# Patient Record
Sex: Female | Born: 1955 | Race: White | Hispanic: No | Marital: Married | Smoking: Never smoker
Health system: Southern US, Community
[De-identification: ages and names within clinical notes are randomized; demographics above are authoritative.]

## PROBLEM LIST (undated history)

## (undated) DIAGNOSIS — Z9889 Other specified postprocedural states: Secondary | ICD-10-CM

## (undated) DIAGNOSIS — R430 Anosmia: Secondary | ICD-10-CM

## (undated) DIAGNOSIS — R112 Nausea with vomiting, unspecified: Secondary | ICD-10-CM

## (undated) DIAGNOSIS — M1990 Unspecified osteoarthritis, unspecified site: Secondary | ICD-10-CM

## (undated) DIAGNOSIS — I471 Supraventricular tachycardia, unspecified: Secondary | ICD-10-CM

## (undated) HISTORY — PX: CHOLECYSTECTOMY: SHX55

## (undated) HISTORY — PX: DILATION AND CURETTAGE OF UTERUS: SHX78

## (undated) HISTORY — PX: COLONOSCOPY: SHX174

---

## 2019-09-24 ENCOUNTER — Other Ambulatory Visit: Payer: Self-pay | Admitting: Podiatry

## 2019-11-02 ENCOUNTER — Other Ambulatory Visit: Payer: Self-pay

## 2020-01-09 ENCOUNTER — Other Ambulatory Visit: Payer: Self-pay

## 2020-01-09 ENCOUNTER — Encounter: Payer: Self-pay | Admitting: Podiatry

## 2020-01-16 ENCOUNTER — Other Ambulatory Visit: Payer: Self-pay | Admitting: Podiatry

## 2020-01-17 NOTE — Anesthesia Preprocedure Evaluation (Addendum)
Anesthesia Evaluation  Patient identified by MRN, date of birth, ID band Patient awake    Reviewed: Allergy & Precautions, NPO status , Patient's Chart, lab work & pertinent test results, reviewed documented beta blocker date and time   History of Anesthesia Complications (+) PONV and history of anesthetic complications  Airway Mallampati: II  TM Distance: >3 FB Neck ROM: Full    Dental   Pulmonary    breath sounds clear to auscultation       Cardiovascular (-) angina(-) DOE  Rhythm:Regular Rate:Normal     Neuro/Psych    GI/Hepatic neg GERD  ,  Endo/Other    Renal/GU      Musculoskeletal  (+) Arthritis ,   Abdominal (+) + obese (BMI 37),   Peds  Hematology   Anesthesia Other Findings   Reproductive/Obstetrics                            Anesthesia Physical Anesthesia Plan  ASA: II  Anesthesia Plan: General   Post-op Pain Management:  Regional for Post-op pain   Induction: Intravenous  PONV Risk Score and Plan: 4 or greater and Propofol infusion, TIVA, Treatment may vary due to age or medical condition, Ondansetron and Dexamethasone  Airway Management Planned: Natural Airway and Nasal Cannula  Additional Equipment:   Intra-op Plan:   Post-operative Plan:   Informed Consent: I have reviewed the patients History and Physical, chart, labs and discussed the procedure including the risks, benefits and alternatives for the proposed anesthesia with the patient or authorized representative who has indicated his/her understanding and acceptance.       Plan Discussed with: CRNA and Anesthesiologist  Anesthesia Plan Comments:         Anesthesia Quick Evaluation

## 2020-01-18 ENCOUNTER — Other Ambulatory Visit
Admission: RE | Admit: 2020-01-18 | Discharge: 2020-01-18 | Disposition: A | Discharge status: Home / Self Care | Payer: 59 | Source: Ambulatory Visit | Attending: Podiatry | Admitting: Podiatry

## 2020-01-18 DIAGNOSIS — Z20822 Contact with and (suspected) exposure to covid-19: Secondary | ICD-10-CM | POA: Diagnosis not present

## 2020-01-18 DIAGNOSIS — Z01812 Encounter for preprocedural laboratory examination: Secondary | ICD-10-CM | POA: Diagnosis not present

## 2020-01-18 LAB — SARS CORONAVIRUS 2 (TAT 6-24 HRS): SARS Coronavirus 2: NEGATIVE

## 2020-01-22 ENCOUNTER — Encounter: Payer: Self-pay | Admitting: Podiatry

## 2020-01-22 ENCOUNTER — Ambulatory Visit: Payer: 59 | Admitting: Anesthesiology

## 2020-01-22 ENCOUNTER — Ambulatory Visit
Admission: RE | Admit: 2020-01-22 | Discharge: 2020-01-22 | Disposition: A | Discharge status: Home / Self Care | Payer: 59 | Attending: Podiatry | Admitting: Podiatry

## 2020-01-22 ENCOUNTER — Other Ambulatory Visit: Payer: Self-pay

## 2020-01-22 ENCOUNTER — Encounter
Admission: RE | Disposition: A | Discharge status: Home / Self Care | Payer: Self-pay | Source: Home / Self Care | Attending: Podiatry

## 2020-01-22 ENCOUNTER — Emergency Department
Admission: EM | Admit: 2020-01-22 | Discharge: 2020-01-22 | Disposition: A | Discharge status: Home / Self Care | Payer: 59 | Source: Home / Self Care | Attending: Student in an Organized Health Care Education/Training Program | Admitting: Student in an Organized Health Care Education/Training Program

## 2020-01-22 DIAGNOSIS — M2141 Flat foot [pes planus] (acquired), right foot: Secondary | ICD-10-CM | POA: Diagnosis not present

## 2020-01-22 DIAGNOSIS — S0501XA Injury of conjunctiva and corneal abrasion without foreign body, right eye, initial encounter: Secondary | ICD-10-CM

## 2020-01-22 DIAGNOSIS — Z09 Encounter for follow-up examination after completed treatment for conditions other than malignant neoplasm: Secondary | ICD-10-CM

## 2020-01-22 DIAGNOSIS — M249 Joint derangement, unspecified: Secondary | ICD-10-CM | POA: Diagnosis not present

## 2020-01-22 DIAGNOSIS — M7741 Metatarsalgia, right foot: Secondary | ICD-10-CM | POA: Diagnosis not present

## 2020-01-22 DIAGNOSIS — M2011 Hallux valgus (acquired), right foot: Secondary | ICD-10-CM | POA: Diagnosis present

## 2020-01-22 DIAGNOSIS — M2041 Other hammer toe(s) (acquired), right foot: Secondary | ICD-10-CM | POA: Insufficient documentation

## 2020-01-22 HISTORY — DX: Other specified postprocedural states: Z98.890

## 2020-01-22 HISTORY — DX: Anosmia: R43.0

## 2020-01-22 HISTORY — DX: Other specified postprocedural states: R11.2

## 2020-01-22 HISTORY — PX: FLAT FOOT CORRECTION: SHX6619

## 2020-01-22 HISTORY — PX: BUNIONECTOMY: SHX129

## 2020-01-22 HISTORY — DX: Unspecified osteoarthritis, unspecified site: M19.90

## 2020-01-22 HISTORY — PX: HAMMER TOE SURGERY: SHX385

## 2020-01-22 SURGERY — BUNIONECTOMY
Anesthesia: General | Site: Toe | Laterality: Right

## 2020-01-22 MED ORDER — LACTATED RINGERS IV SOLN
100.0000 mL/h | INTRAVENOUS | Status: DC
  Administered 2020-01-22: 100 mL/h via INTRAVENOUS

## 2020-01-22 MED ORDER — PROPOFOL 10 MG/ML IV BOLUS
INTRAVENOUS | Status: DC | PRN
  Administered 2020-01-22 (×3): 20 mg via INTRAVENOUS

## 2020-01-22 MED ORDER — POVIDONE-IODINE 7.5 % EX SOLN: Freq: Once | CUTANEOUS | Status: DC

## 2020-01-22 MED ORDER — FENTANYL CITRATE (PF) 100 MCG/2ML IJ SOLN
INTRAMUSCULAR | Status: DC | PRN
  Administered 2020-01-22: 100 ug via INTRAVENOUS

## 2020-01-22 MED ORDER — TETRACAINE HCL 0.5 % OP SOLN
2.0000 [drp] | Freq: Once | OPHTHALMIC | Status: DC
  Filled 2020-01-22: qty 4

## 2020-01-22 MED ORDER — OXYCODONE HCL 5 MG/5ML PO SOLN: 5.0000 mg | Freq: Once | ORAL | Status: DC | PRN

## 2020-01-22 MED ORDER — ROPIVACAINE HCL 5 MG/ML IJ SOLN
INTRAMUSCULAR | Status: DC | PRN
  Administered 2020-01-22: 25 mL via EPIDURAL
  Administered 2020-01-22: 15 mL via EPIDURAL

## 2020-01-22 MED ORDER — PROMETHAZINE HCL 25 MG/ML IJ SOLN: 6.2500 mg | INTRAMUSCULAR | Status: DC | PRN

## 2020-01-22 MED ORDER — EPHEDRINE SULFATE 50 MG/ML IJ SOLN
INTRAMUSCULAR | Status: DC | PRN
  Administered 2020-01-22: 5 mg via INTRAVENOUS

## 2020-01-22 MED ORDER — LIDOCAINE HCL (CARDIAC) PF 100 MG/5ML IV SOSY
PREFILLED_SYRINGE | INTRAVENOUS | Status: DC | PRN
  Administered 2020-01-22: 40 mg via INTRAVENOUS

## 2020-01-22 MED ORDER — MIDAZOLAM HCL 2 MG/2ML IJ SOLN
INTRAMUSCULAR | Status: DC | PRN
  Administered 2020-01-22: 1 mg via INTRAVENOUS

## 2020-01-22 MED ORDER — OXYCODONE HCL 5 MG PO TABS: 5.0000 mg | ORAL_TABLET | Freq: Once | ORAL | Status: DC | PRN

## 2020-01-22 MED ORDER — FLUORESCEIN SODIUM 1 MG OP STRP
1.0000 | ORAL_STRIP | Freq: Once | OPHTHALMIC | Status: DC
  Filled 2020-01-22: qty 1

## 2020-01-22 MED ORDER — ONDANSETRON HCL 4 MG PO TABS: 4.0000 mg | ORAL_TABLET | Freq: Three times a day (TID) | ORAL | 1 refills | Status: AC | PRN

## 2020-01-22 MED ORDER — CEFAZOLIN SODIUM-DEXTROSE 2-4 GM/100ML-% IV SOLN
2.0000 g | INTRAVENOUS | Status: AC
  Administered 2020-01-22: 2 g via INTRAVENOUS

## 2020-01-22 MED ORDER — CEPHALEXIN 500 MG PO CAPS: 500.0000 mg | ORAL_CAPSULE | Freq: Four times a day (QID) | ORAL | 0 refills | Status: AC

## 2020-01-22 MED ORDER — APIXABAN 2.5 MG PO TABS: 2.5000 mg | ORAL_TABLET | Freq: Two times a day (BID) | ORAL | 1 refills | Status: DC

## 2020-01-22 MED ORDER — OXYCODONE-ACETAMINOPHEN 7.5-325 MG PO TABS: 1.0000 | ORAL_TABLET | Freq: Four times a day (QID) | ORAL | 0 refills | Status: DC | PRN

## 2020-01-22 MED ORDER — HYDROMORPHONE HCL 1 MG/ML IJ SOLN: 0.2500 mg | INTRAMUSCULAR | Status: DC | PRN

## 2020-01-22 MED ORDER — PROPOFOL 500 MG/50ML IV EMUL
INTRAVENOUS | Status: DC | PRN
  Administered 2020-01-22: 100 ug/kg/min via INTRAVENOUS
  Administered 2020-01-22: 75 ug/kg/min via INTRAVENOUS

## 2020-01-22 MED ORDER — KETOROLAC TROMETHAMINE 0.5 % OP SOLN: 1.0000 [drp] | Freq: Four times a day (QID) | OPHTHALMIC | 0 refills | Status: DC

## 2020-01-22 MED ORDER — POLYMYXIN B-TRIMETHOPRIM 10000-0.1 UNIT/ML-% OP SOLN: 2.0000 [drp] | Freq: Four times a day (QID) | OPHTHALMIC | 0 refills | Status: DC

## 2020-01-22 SURGICAL SUPPLY — 66 items
BIT DRILL 2 FENESTRATED (MISCELLANEOUS) IMPLANT
BIT DRILL CANNULTD 2.6 X 130MM (DRILL) IMPLANT
BIT DRILL SOLID 2.0 X 110MM (DRILL) IMPLANT
BIT DRILLL 2 FENESTRATED (MISCELLANEOUS) ×2
BLADE OSC/SAGITTAL MD 5.5X18 (BLADE) ×2 IMPLANT
BLADE OSCILLATING/SAGITTAL (BLADE) ×2
BLADE SURG 15 STRL LF DISP TIS (BLADE) ×3 IMPLANT
BLADE SURG 15 STRL SS (BLADE) ×6
BLADE SW THK.38XMED LNG THN (BLADE) IMPLANT
BNDG COHESIVE 4X5 TAN STRL (GAUZE/BANDAGES/DRESSINGS) ×5 IMPLANT
BNDG ELASTIC 4X5.8 VLCR NS LF (GAUZE/BANDAGES/DRESSINGS) ×5 IMPLANT
BNDG ELASTIC 6X5.8 VLCR NS LF (GAUZE/BANDAGES/DRESSINGS) ×5 IMPLANT
BNDG ESMARK 4X12 TAN STRL LF (GAUZE/BANDAGES/DRESSINGS) ×5 IMPLANT
BNDG GAUZE 4.5X4.1 6PLY STRL (MISCELLANEOUS) ×5 IMPLANT
BNDG STRETCH 4X75 STRL LF (GAUZE/BANDAGES/DRESSINGS) ×5 IMPLANT
CANISTER SUCT 1200ML W/VALVE (MISCELLANEOUS) ×5 IMPLANT
CLIP FIXATION STAPLE 10X10X10 (Staple) ×2 IMPLANT
COUNTERSICK 4.0 HEADED (MISCELLANEOUS) ×5
COVER LIGHT HANDLE UNIVERSAL (MISCELLANEOUS) ×10 IMPLANT
CUFF TOURN SGL QUICK 34 (TOURNIQUET CUFF) ×2
CUFF TRNQT CYL 34X4.125X (TOURNIQUET CUFF) IMPLANT
DRAPE FLUOR MINI C-ARM 54X84 (DRAPES) ×5 IMPLANT
DRILL CANNULATED 2.6 X 130MM (DRILL) ×5
DRILL SOLID 2.0 X 110MM (DRILL) ×5
DURAPREP 26ML APPLICATOR (WOUND CARE) ×5 IMPLANT
ELECT REM PT RETURN 9FT ADLT (ELECTROSURGICAL) ×5
ELECTRODE REM PT RTRN 9FT ADLT (ELECTROSURGICAL) ×3 IMPLANT
GAUZE SPONGE 4X4 12PLY STRL (GAUZE/BANDAGES/DRESSINGS) ×5 IMPLANT
GAUZE XEROFORM 1X8 LF (GAUZE/BANDAGES/DRESSINGS) ×7 IMPLANT
GLOVE BIO SURGEON STRL SZ7 (GLOVE) ×5 IMPLANT
GLOVE BIOGEL PI IND STRL 7.0 (GLOVE) ×3 IMPLANT
GLOVE BIOGEL PI INDICATOR 7.0 (GLOVE) ×2
GOWN STRL REUS W/ TWL LRG LVL3 (GOWN DISPOSABLE) ×6 IMPLANT
GOWN STRL REUS W/TWL LRG LVL3 (GOWN DISPOSABLE) ×4
K-WIRE SMOOTH TROCAR 2.0X150 (WIRE) ×10
K-WIRE SNGL END 1.2X150 (MISCELLANEOUS) ×10
KIT PROCEDURE DRILL (DRILL) ×2 IMPLANT
KIT TURNOVER KIT A (KITS) ×5 IMPLANT
KWIRE SMOOTH TROCAR 2.0X150 (WIRE) IMPLANT
KWIRE SNGL END 1.2X150 (MISCELLANEOUS) IMPLANT
NS IRRIG 500ML POUR BTL (IV SOLUTION) ×5 IMPLANT
PACK EXTREMITY ARMC (MISCELLANEOUS) ×5 IMPLANT
PADDING CAST BLEND 4X4 NS (MISCELLANEOUS) ×14 IMPLANT
PENCIL SMOKE EVACUATOR (MISCELLANEOUS) ×5 IMPLANT
PLATE LAPIDUS RT GS 4H (Plate) ×2 IMPLANT
RASP SM TEAR CROSS CUT (RASP) ×2 IMPLANT
SCREW CANN 4.0X46ST (Screw) ×2 IMPLANT
SCREW COUNTERSINK 4.0 HEADED (MISCELLANEOUS) IMPLANT
SCREW LOCK PLATE R3 2.7X11 (Screw) ×2 IMPLANT
SCREW LOCK PLATE R3 2.7X12 (Screw) ×2 IMPLANT
SCREW LOCK PLATE R3 2.7X13 (Screw) ×2 IMPLANT
SCREW LOCK PLATE R3 2.7X14 (Screw) ×2 IMPLANT
SPLINT CAST 1 STEP 4X30 (MISCELLANEOUS) ×5 IMPLANT
STAPLE DYNACLIP 8X8 (Staple) ×2 IMPLANT
STOCKINETTE IMPERVIOUS LG (DRAPES) ×5 IMPLANT
SUT ETHILON 3-0 (SUTURE) ×2 IMPLANT
SUT ETHILON 4-0 (SUTURE) ×8
SUT ETHILON 4-0 FS2 18XMFL BLK (SUTURE) ×12
SUT VIC AB 2-0 SH 27 (SUTURE) ×6
SUT VIC AB 2-0 SH 27XBRD (SUTURE) IMPLANT
SUT VIC AB 4-0 FS2 27 (SUTURE) ×7 IMPLANT
SUT VICRYL AB 3-0 FS1 BRD 27IN (SUTURE) ×2 IMPLANT
SUTURE ETHLN 4-0 FS2 18XMF BLK (SUTURE) IMPLANT
WEDGE GRAFT TISSUE EVANS 10MM (Insert) ×2 IMPLANT
WEDGE LAPIDUS 8MM (Bone Implant) ×2 IMPLANT
WIRE OLIVE SMOOTH 1.4MMX60MM (WIRE) ×4 IMPLANT

## 2020-01-22 NOTE — Anesthesia Procedure Notes (Signed)
Performed by: Jeronica Stlouis, CRNA Pre-anesthesia Checklist: Patient identified, Emergency Drugs available, Suction available, Timeout performed and Patient being monitored Patient Re-evaluated:Patient Re-evaluated prior to induction Oxygen Delivery Method: Simple face mask Placement Confirmation: positive ETCO2       

## 2020-01-22 NOTE — ED Triage Notes (Signed)
Pt here from home via [facility]. Pt was bought by ambulance due to husband being unable to safely transport her to the hospital.   Pt complaining of right sided eye pain x 8hr, eye is swollen with increased tear production. Pt tried flushing eye at home and felt something moving inside of it. Vision has been limited.  Pt had right foot surgery today and was placed under general anesthesia. Pt areas with splint in place.   Ems VSS- bp 140/88, HR 80, T 98.7, RR 20.

## 2020-01-22 NOTE — Anesthesia Procedure Notes (Signed)
Anesthesia Regional Block: Popliteal block   Pre-Anesthetic Checklist: ,, timeout performed, Correct Patient, Correct Site, Correct Laterality, Correct Procedure, Correct Position, site marked, Risks and benefits discussed,  Surgical consent,  Pre-op evaluation,  At surgeon's request and post-op pain management  Laterality: Right  Prep: chloraprep       Needles:  Injection technique: Single-shot  Needle Type: Echogenic Needle     Needle Length: 9cm  Needle Gauge: 21     Additional Needles:   Procedures:,,,, ultrasound used (permanent image in chart),,,,  Narrative:  Injection made incrementally with aspirations every 5 mL.  Performed by: Personally  Anesthesiologist: Diante Barley A, MD  Additional Notes: Functioning IV was confirmed and monitors applied. Ultrasound guidance: relevant anatomy identified, needle position confirmed, local anesthetic spread visualized around nerve(s)., vascular puncture avoided.  Image printed for medical record.  Negative aspiration and no paresthesias; incremental administration of local anesthetic. The patient tolerated the procedure well. Vitals signes recorded in RN notes.      

## 2020-01-22 NOTE — Op Note (Signed)
PODIATRY / FOOT AND ANKLE SURGERY OPERATIVE REPORT    SURGEON: Rosetta Posner, DPM  PRE-OPERATIVE DIAGNOSIS: All right foot 1.  Hallux valgus with first tarsometatarsal joint hypermobility 2.  Hammertoe contractures digits 3 through 5, semiflexible 3.  Hammertoe contracture second digit DIPJ, rigid 4.  Pes planus with associated posterior tibial tendon dysfunction  POST-OPERATIVE DIAGNOSIS: Same  PROCEDURE(S): All right foot 1. Evans calcaneal osteotomy with bone graft placement 2. Lapidus bunionectomy with use of cotton type/Lapidus wedge with modified McBride procedure 3. Akin osteotomy 4. Second toe DIPJ arthroplasty 5. Flexor tenotomy digits 3, 4, 5  HEMOSTASIS: Right thigh tourniquet  ANESTHESIA: general with preoperative saphenous and popliteal nerve block  ESTIMATED BLOOD LOSS: 30 cc  FINDING(S): 1.  Not applicable  PATHOLOGY/SPECIMEN(S): None  INDICATIONS:   Tammy Vang is a 64 y.o. female who presents with a painful flatfoot deformity and pain to the first metatarsal phalangeal joint.  Patient also complains of hammertoe contractures digits 2 through 5.  Patient has exhausted all conservative therapies.  X-rays were taken in clinic and discussed with patient showing her flatfoot deformity including decreased calcaneal inclination angle, increased talar declination angle, increased forefoot abductus with approximately 40 to 50% of the talar head being uncovered, large first intermetatarsal space angle with rotation of the first metatarsal head and the sesamoids being in the first interspace with increased hallux interphalangeus and hallux abductus angle, hammertoe contractures digits 2 through 5 bilateral.  Discussed all treatment options for these conditions including conservative and surgical attempts at correction including the benefits and complications.  Discussed with patient that surgery can realign joints and potentially reduce some of the pains that she is having  overall.  Patient again states that she is exhausted all conservative therapies and would like to proceed with surgery.  Postoperative course gone over in detail for procedure.  All questions have been answered and consent has been signed prior to procedure  DESCRIPTION: After obtaining full informed written consent, the patient was brought back to the operating room and placed supine upon the operating table.  The patient received IV antibiotics prior to induction.  A popliteal and saphenous nerve block was performed by anesthesia in the preoperative area.  A thigh tourniquet was placed to the right thigh.  After obtaining adequate anesthesia, the patient was prepped and draped in the standard fashion.  An Esmarch bandage was used to exsanguinate the right lower extremity and pneumatic thigh tourniquet was inflated.  Attention was then directed to the lateral aspect of the right foot where under fluoroscopic guidance an incision was then made over the anterior process the calcaneus slightly dorsal to the peroneal tendons from the calcaneocuboid joint to the posterior facet of the subtalar joint.  The incision was deepened to the subcutaneous tissues utilizing sharp and blunt dissection care was taken the sural nerve was identified and retracted plantarly and posteriorly throughout the remainder the case.  At this time the peroneal tendon sheath was identified in the lateral wall of the calcaneus at the anterior process level and the extensor digitorum brevis muscle belly.  An incision was made between the junction of the extensor digitorum brevis muscle belly and the peroneal tendon sheath into the periosteum of the anterior process of the calcaneus.  The peroneal tendons were then retracted plantarly and posteriorly throughout the remainder the case and the extensor digitorum brevis muscle belly was retracted dorsally thereby exposing the anterior process.  The calcaneocuboid joint was identified under  fluoroscopic guidance and  utilizing the sagittal bone saw a Evans calcaneal osteotomy was performed approximately 1.5 cm proximal to the calcaneocuboid joint again under fluoroscopic guidance.  The osteotomy was then completed with osteotomes trying to keep the medial cortex intact.  The pin distractor was then placed and the osteotomy was distracted and a 1 cm trial wedge was then placed into the osteotomy site from Paragon 28.  An AP was taken of the foot showing talar head coverage of approximately 100% much improved since the preoperative state.  Lateral image was also taken showing increased calcaneal inclination angle, increased talar declination angle.  At this time the trial wedge was removed and a 10 mm Paragon 28 Evans wedge was placed using standard manufactures protocol.  The pin distractor was then removed and the osteotomy was compressed.  The plantar lateral aspect of the graft was resected with a rongeur to prevent peroneal tendon irritation.  The surgical site was flushed with copious amounts normal sterile saline.  The peroneal tendon sheath and extensor digitorum brevis muscle bellies were reapproximated well coapted with 2-0 Vicryl.  The subcutaneous tissue was reapproximated well coapted with 3-0 Vicryl.  And the skin was then reapproximated well coapted with 4-0 nylon horizontal mattress and simple type stitching.  Attention was then directed to the dorsal medial aspect first tarsometatarsal joint area and first metatarsal phalangeal joint where a linear longitudinal incision was made medial to the tendon of the extensor hallucis longus and involved the contour deformity and this incision was made all the way from the first tarsometatarsal joint to the shaft of the proximal phalanx of the hallux.  The incision was deepened to the subcutaneous tissues utilizing sharp and blunt dissection and care was taken to identify and retract all vital neurovascular structures and all venous contributories  were cauterized as necessary along the entire course the incision.  A capsular incision was then made into the first metatarsal phalangeal joint and first tarsometatarsal joint areas and the capsular periosteal tissue was reflected medially and laterally at the operative site thereby exposing the first tarsometatarsal joint and first metatarsal phalangeal joint.  The medial eminence was resected at the first metatarsal head and passed off the operative site.  A lateral release was then performed releasing the lateral collateral and suspensory ligaments as well as the conjoined tendon of the abductor hallucis tendon at the level of the first metatarsal phalangeal joint through the same incision.  Attention was then directed to the first tarsometatarsal joint with a sagittal bone saw was used to resect a wedge taking more laterally than medially from the articular surface distally of the medial cuneiform and this wedge was passed off the operative site.  Hand resection was then performed with a curette and osteotome to the base of the first metatarsal.  The surgical site was flushed with copious amounts normal sterile saline.  The first tarsometatarsal joint was then inspected for any further cartilage and none appeared to remain at this time.  Fenestration was then performed of the joint surface at the first tarsometatarsal joint with a 2.27mm drill bit to enhance fusion capabilities.  And for scaling was performed with a sagittal bone saw.  At this time the cotton type Lapidus wedges were placed into the first tarsometatarsal joint approximately an 8 mm Paragon 28 Lapidus wedge appeared to fit the best overall and obtained the most correction of the first interspace and plantarflex the first tarsal metatarsal joint acting as a cotton type of fusion.  This was  checked under fluoroscopic guidance.  At this time an 8 mm Paragon 28 Lapidus plantarflexory wedge was placed in the first tarsometatarsal joint and a guidewire  was then drilled into the medial aspect of the first metatarsal base.  The first metatarsal was then derotated and held in a reduced position while the wedge was placed utilizing manufactures protocol.  This reduction was then checked under fluoroscopic guidance in the first interspace appeared to be well reduced within normal limits and the sesamoids appeared to be under the first metatarsal head.  Utilizing a guidewire for a 4.0 cannulated Paragon 28 screw the guidewire was placed from the dorsal base of the first metatarsal across the graft site of the first tarsometatarsal joint and into the medial cuneiform.  This was checked under fluoroscopic guidance and noted to sit excellently.  Another guidewire was placed for temporary fixation across the first tarsometatarsal joint.  Utilizing standard AO principles and techniques a 4.0 x 46 mm partially-threaded cannulated Paragon 28 screw was placed across the tarsometatarsal joint with excellent compression noted.  The dorsal aspect of the first tarsometatarsal joint graft was then smoothed down with the sagittal bone saw and reciprocating rasp.  There appeared to be excellent compression across the first tarsometatarsal joint.  At this time the Paragon 28 Lapidus plate was then placed over the dorsal medial aspect of the first tarsometatarsal joint and over the graft area.  It was then held in place with temporary fixation olive wires.  Under fluoroscopic guidance the position was checked and noted to sit excellently.  Utilizing standard AO principles and techniques to distal screws were placed into the first metatarsal base and 2 proximal screws were placed into the medial cuneiform.  The screw sizes were all 2.7 mm and varied in size according to the measurement that was taken with standard technique.  It was noted at this time that there appeared to be a fairly significant extensor tendon contracture at the level of the first metatarsal phalangeal joints with  the foot loaded so the extensor tendon was lengthened at the operative site and a Z-type of lengthening and held with the appropriate tension and a running interconnected stitch was performed with 3-0 Vicryl.  The tendon appeared to be more reducible as it did not appear to be hyperextending the first metatarsal phalangeal joint anymore.  After this procedure was performed the pneumatic thigh tourniquet was deflated and a prompt hyperemic response was noted all digits of the right foot.  The tourniquet remained down for the remainder the case.  It was still noted that with the foot loaded that the hallux interphalangeus angle still needed to be corrected so attention was directed to the midshaft portion of the proximal phalanx of the hallux where a Akin type osteotomy was performed.  The 3 to 4 mm bone wedge was passed off in the operative site.  Feathering was then performed of the osteotomy to close down the osteotomy further.  The lateral hinge of the osteotomy was broken during this portion of the procedure but the toes still able to be reduced in appropriate position manually.  Fixation was able to be obtained utilizing standard AO principles and techniques with a Paragon 28 staple with excellent compression noted.  Stressing was performed of the osteotomy site after the staple was placed and there appeared to be no movement and the position appeared to be excellent and rectus.  The surgical site was flushed with copious amounts normal sterile saline across the first  tarsometatarsal joint and first metatarsal phalangeal joint and big toe areas.  The capsular and periosteal tissue at all sites was then reapproximated well coapted with 2-0 Vicryl while holding the big toe in a rectus position.  The subcutaneous tissues were approximated well coapted with 3-0 Vicryl.  The skin was then reapproximated well coapted with 4-0 nylon combination of simple and horizontal mattress type stitching.  Attention was then  directed to the DIPJ of the right second toe where 2 converging semielliptical incisions were made over this area and a skin wedge was ellipsed and passed off the operative site.  An extensor tenotomy capsulotomy was performed followed by release the collateral ligaments.  The extensor tendon was reflected proximally thereby exposing the head of the middle phalanx at the operative site.  The head of the middle phalanx was resected with sagittal bone saw and passed off the operative site.  The surgical site was flushed copious amounts normal sterile saline.  The extensor tendon was then reapproximated well coapted with 3-0 Vicryl and the skin was then reapproximated well coapted with 4-0 nylon simple type stitching.  Attention was then directed to the lateral aspects of digits 3, 4, 5 where percutaneous stab incisions were made at the midshaft of the proximal phalanx at each toe and a percutaneous release of the flexor tendons was performed at each digit which reduced the adductovarus contractures of digits 3, 4, 5 due to the flexor tendon poles.  The foot was loaded and the toes appeared to sit in a more rectus position overall with still slight adductovarus contractures of 3, 4, and 5 but much improved.  The 3 small stab incisions were then reapproximated well coapted with 4-0 nylon in simple type stitching.  Final C-arm imaging was then taken showing reduction of the flatfoot deformity with increased calcaneal inclination angle, decreased talar declination angle, increased coverage of the talar head of approximately 100% or close to it.  The intermetatarsal angle appeared to be well reduced at the first interspace and the sesamoids appear to be under the first metatarsal head.  The hallux appeared to sit in a more rectus position overall and the hallux interphalangeus angle was well reduced within normal limits with excellent fixation noted across all areas.  The first metatarsal appeared to be plantarflexed  compared to the preoperative state to increase the arch height with the foot loaded and the first metatarsal appeared to be in line with the talus.  The second hammertoe contracture appeared to be well reduced at the DIPJ and the adductovarus contracture appeared to be improved after the flexor tendon releases.  A postoperative dressing was then applied consisting of Xeroform to the incision sites followed by 4 x 4 gauze, Kling, Kerlix, web roll, posterior splint, Ace wrap.  The patient tolerated the procedure and anesthesia well was transferred to the recovery room vital signs stable vascular status intact all toes the right foot.  Patient will be discharged home with the appropriate discharge information is to be nonweightbearing at all times to right lower extremity and take medications as prescribed.  COMPLICATIONS: None  CONDITION: Good, stable  Rosetta Posner, DPM

## 2020-01-22 NOTE — Anesthesia Procedure Notes (Signed)
Anesthesia Regional Block: Adductor canal block   Pre-Anesthetic Checklist: ,, timeout performed, Correct Patient, Correct Site, Correct Laterality, Correct Procedure, Correct Position, site marked, Risks and benefits discussed,  Surgical consent,  Pre-op evaluation,  At surgeon's request and post-op pain management  Laterality: Right  Prep: chloraprep       Needles:  Injection technique: Single-shot  Needle Type: Echogenic Needle     Needle Length: 9cm  Needle Gauge: 21     Additional Needles:   Procedures:,,,, ultrasound used (permanent image in chart),,,,  Narrative:  Injection made incrementally with aspirations every 5 mL.  Performed by: Personally  Anesthesiologist: Ashyah Quizon A, MD  Additional Notes: Functioning IV was confirmed and monitors applied. Ultrasound guidance: relevant anatomy identified, needle position confirmed, local anesthetic spread visualized around nerve(s)., vascular puncture avoided.  Image printed for medical record.  Negative aspiration and no paresthesias; incremental administration of local anesthetic. The patient tolerated the procedure well. Vitals signes recorded in RN notes.      

## 2020-01-22 NOTE — Transfer of Care (Signed)
Immediate Anesthesia Transfer of Care Note  Patient: Tammy Vang  Procedure(s) Performed: Arbutus Leas LAPIDUS TYPE + AKIN RIGHT (Right Toe) HAMMER TOE CORRECTION (Right Toe) EVANS/MCDO (Right )  Patient Location: PACU  Anesthesia Type: General  Level of Consciousness: awake, alert  and patient cooperative  Airway and Oxygen Therapy: Patient Spontanous Breathing and Patient connected to supplemental oxygen  Post-op Assessment: Post-op Vital signs reviewed, Patient's Cardiovascular Status Stable, Respiratory Function Stable, Patent Airway and No signs of Nausea or vomiting  Post-op Vital Signs: Reviewed and stable  Complications: No apparent anesthesia complications

## 2020-01-22 NOTE — H&P (Signed)
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  The various methods of treatment have been discussed with the patient.  No guarantees were given.  After consideration of risks, benefits and other options for treatment, the patient has consented to surgery.  I have reviewed the patients' chart and labs.    PROCEDURE: ALL RIGHT FOOT 1. EVANS CALCANEAL OSTEOTOMY 2. LAPIDUS BUNIONECTOMY WITH AKIN OSTEOTOMY 3. 2ND HAMMERTOE REPAIR 4. POSSIBLE 2ND METATARSAL WEIL OSTEOTOMY 5. FLEXOR TENDOTOMY DIGITS 3,4,5  A history and physical examination was performed in my office.  The patient was reexamined.  There have been no changes to this history and physical examination.  Rosetta Posner, DPM

## 2020-01-22 NOTE — Progress Notes (Signed)
Pt complained of right eye watering, eye noted to be red on assessment. Dr. Etheleen Nicks informed. No new orders received. MD instructed pt to inform us tomorrow if eye discomfort did not improve after using OTC eye gtts at home, this was relayed to pt's husband as well by myself.

## 2020-01-22 NOTE — ED Provider Notes (Signed)
[facility] Emergency Department Provider Note  ____________________________________________  Time seen: Approximately 10:29 PM  I have reviewed the triage vital signs and the nursing notes.   HISTORY  Chief Complaint Eye Pain    HPI Tammy Vang is a 64 y.o. female who presents the emergency department complaining of right eye pain, increased tearing to the right eye.  Patient had foot surgery today, was asymptomatic prior to surgery.  On waking in recovery room patient had a burning/sharp sensation to the external surface of her eye.  Symptoms were mild at the time, patient was discharged and she states that the pain has been worsening.  Patient has no complaints with her foot and is only concerned about her right eye.  She wears glasses but no contacts.  Patient states that as she was going under anesthesia she partially remembers one of the oxygen masks sliding up into her eye.  No visual changes.  No loss of sight.  Patient is used normal saline drops without relief.         Past Medical History:  Diagnosis Date  . 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  . Arthritis    knees  . PONV (postoperative nausea and vomiting)    after D&C and Cholecystectomy    There are no problems to display for this patient.   Past Surgical History:  Procedure Laterality Date  . BUNIONECTOMY Right   . CHOLECYSTECTOMY    . COLONOSCOPY    . DILATION AND CURETTAGE OF UTERUS    . EYE SURGERY      Prior to Admission medications   Medication Sig Start Date End Date Taking? Authorizing Provider  apixaban (ELIQUIS) 2.5 MG TABS tablet Take 1 tablet (2.5 mg total) by mouth 2 (two) times daily. 01/22/20 03/04/20  Caroline More, DPM  cephALEXin (KEFLEX) 500 MG capsule Take 1 capsule (500 mg total) by mouth 4 (four) times daily for 7 days. 01/22/20 01/29/20  Caroline More, DPM  ketorolac (ACULAR) 0.5 % ophthalmic solution Place 1  drop into the right eye 4 (four) times daily. 01/22/20   Tallie Dodds, Charline Bills, PA-C  ondansetron (ZOFRAN) 4 MG tablet Take 1 tablet (4 mg total) by mouth every 8 (eight) hours as needed for up to 7 days for nausea or vomiting. 01/22/20 01/29/20  Caroline More, DPM  oxyCODONE-acetaminophen (PERCOCET) 7.5-325 MG tablet Take 1 tablet by mouth every 6 (six) hours as needed for severe pain. 01/22/20   Caroline More, DPM  trimethoprim-polymyxin b (POLYTRIM) ophthalmic solution Place 2 drops into the right eye every 6 (six) hours. 01/22/20   Rand Etchison, Charline Bills, PA-C    Allergies Gluten meal  No family history on file.  Social History Social History   Tobacco Use  . Smoking status: Never Smoker  . Smokeless tobacco: Never Used  Substance Use Topics  . Alcohol use: Not Currently  . Drug use: Never     Review of Systems  Constitutional: No fever/chills Eyes: No visual changes.  Right eye pain with increased tearing ENT: No upper respiratory complaints. Cardiovascular: no chest pain. Respiratory: no cough. No SOB. Gastrointestinal: No abdominal pain.  No nausea, no vomiting.  No diarrhea.  No constipation. Musculoskeletal: Negative for musculoskeletal pain. Skin: Negative for rash, abrasions, lacerations, ecchymosis. Neurological: Negative for headaches, focal weakness or numbness. 10-point ROS otherwise negative.  ____________________________________________   PHYSICAL EXAM:  VITAL SIGNS: ED Triage Vitals  Enc Vitals  Group     BP 01/22/20 2152 137/87     Pulse Rate 01/22/20 2152 (!) 102     Resp 01/22/20 2152 15     Temp 01/22/20 2147 97.8 F (36.6 C)     Temp Source 01/22/20 2147 Oral     SpO2 01/22/20 2152 96 %     Weight 01/22/20 2148 240 lb (108.9 kg)     Height 01/22/20 2148 5\' 8"  (1.727 m)     Head Circumference --      Peak Flow --      Pain Score 01/22/20 2147 5     Pain Loc --      Pain Edu? --      Excl. in GC? --      Constitutional: Alert and oriented.  Well appearing and in no acute distress. Eyes: Conjunctivae are normal. PERRL. EOMI. funduscopic exam reveals red reflex bilaterally.  Vasculature and optic disc is unremarkable bilaterally.  Eye is anesthetized using tetracaine.  Fluorescein staining applied with areas of uptake in the 3 o'clock position consistent with corneal abrasion.  No foreign body. Head: Atraumatic. ENT:      Ears:       Nose: No congestion/rhinnorhea.      Mouth/Throat: Mucous membranes are moist.  Neck: No stridor.    Cardiovascular: Normal rate, regular rhythm. Normal S1 and S2.  Good peripheral circulation. Respiratory: Normal respiratory effort without tachypnea or retractions. Lungs CTAB. Good air entry to the bases with no decreased or absent breath sounds. Musculoskeletal: Full range of motion to all extremities. No gross deformities appreciated.  Patient with splint to the right lower extremity from surgery today. Neurologic:  Normal speech and language. No gross focal neurologic deficits are appreciated.  Skin:  Skin is warm, dry and intact. No rash noted. Psychiatric: Mood and affect are normal. Speech and behavior are normal. Patient exhibits appropriate insight and judgement.   ____________________________________________   LABS (all labs ordered are listed, but only abnormal results are displayed)  Labs Reviewed - No data to display ____________________________________________  EKG   ____________________________________________  RADIOLOGY   No results found.  ____________________________________________    PROCEDURES  Procedure(s) performed:    Procedures    Medications  fluorescein ophthalmic strip 1 strip (has no administration in time range)  tetracaine (PONTOCAINE) 0.5 % ophthalmic solution 2 drop (has no administration in time range)     ____________________________________________   INITIAL IMPRESSION / ASSESSMENT AND PLAN / ED COURSE  Pertinent labs & imaging  results that were available during my care of the patient were reviewed by me and considered in my medical decision making (see chart for details).  Review of the [place] CSRS was performed in accordance of the [facility] prior to dispensing any controlled drugs.           Patient's diagnosis is consistent with corneal abrasion.  Patient presented to the emergency department complaining of right eye pain.  Patient had surgery today on her foot.  Patient believes that she was going under anesthesia one of the oxygen mask slipped up and made contact with her eye.  This is likely source of abrasion.  Superficial with no retained foreign body.  Patient be placed on antibiotics and Acular for symptom relief.  Follow-up with ophthalmology or primary care as needed.  No complications with the foot surgery at this time.  Splint is left in place and direct visualization of the lower extremity is not appreciated as patient has no complaints  at this time.  Follow-up with orthopedic/podiatry for surgical follow-up of her foot..  Patient is given ED precautions to return to the ED for any worsening or new symptoms.     ____________________________________________  FINAL CLINICAL IMPRESSION(S) / ED DIAGNOSES  Final diagnoses:  Abrasion of right cornea, initial encounter      NEW MEDICATIONS STARTED DURING THIS VISIT:  ED Discharge Orders         Ordered    trimethoprim-polymyxin b (POLYTRIM) ophthalmic solution  Every 6 hours     01/22/20 2331    ketorolac (ACULAR) 0.5 % ophthalmic solution  4 times daily     01/22/20 2331              This chart was dictated using voice recognition software/Dragon. Despite best efforts to proofread, errors can occur which can change the meaning. Any change was purely unintentional.    Racheal Patches, PA-C 01/22/20 2331    Willy Eddy, MD 01/22/20 2351

## 2020-01-22 NOTE — Anesthesia Postprocedure Evaluation (Signed)
Anesthesia Post Note  Patient: Tammy Vang  Procedure(s) Performed: Dorena Dew TYPE + AKIN RIGHT (Right Toe) HAMMER TOE CORRECTION (Right Toe) EVANS/MCDO (Right Foot)     Patient location during evaluation: PACU Anesthesia Type: General Level of consciousness: awake and alert Pain management: pain level controlled Vital Signs Assessment: post-procedure vital signs reviewed and stable Respiratory status: spontaneous breathing, nonlabored ventilation, respiratory function stable and patient connected to nasal cannula oxygen Cardiovascular status: blood pressure returned to baseline and stable Postop Assessment: no apparent nausea or vomiting Anesthetic complications: no    Loriana Samad A  Lanora Reveron

## 2020-01-22 NOTE — Discharge Instructions (Signed)
[facility]  POST OPERATIVE INSTRUCTIONS FOR DR. TROXLER, DR. Ether Griffins, AND DR. Moxie Kalil [facility] POD[facility]ATRY [facility]PART[facility]   1. Take your medication as prescribed.  Pain medication should be taken only as needed.  You may take 1 Percocet pain tablet every 6 hours as needed for pain.  You may also take ibuprofen and Tylenol between doses.  The maximum dose for Tylenol per day is 4000 mg.  The maximum dose of ibuprofen per day is 3200 mg.  If the pain is still unbearable you may take 1 pain pill every 4 hours.  If it still painful after that then you may take 2 pain pills every 6 hours.  Try taking as minimal pain medication as possible to relieve your pain and discomfort but you still will likely have some pain after the procedure.  Please call for further instruction if problems persist.  2. Start taking Eliquis blood thinner medication 24 hours after the procedure so around 430pm on 01/23/2020.  Work on flexion extension exercises of the knee and calf massages daily to ensure reduction and chance of blood clot formation.  Also take antibiotics as prescribed until gone.  Take antinausea medication as needed.  3. Keep the dressing clean, dry and intact.  4. Keep your foot elevated above the heart level for the first 48 hours.  5. Walking to the bathroom and brief periods of walking are acceptable, unless we have instructed you to be non-weight bearing.  6. Always wear your post-op shoe when walking.  Always use your crutches if you are to be non-weight bearing.  7. Do not take a shower. Baths are permissible as long as the foot is kept out of the water.   8. Every hour you are awake:  - Bend your knee 15 times. - Massage calf 15 times  9. Call [facility] [phone]) if any of the following problems occur: - You develop a temperature or fever. - The bandage becomes saturated with blood. - Medication does not stop your pain. - Injury of the  foot occurs. - Any symptoms of infection including redness, odor, or red streaks running from wound.   General Anesthesia, Adult, Care After This sheet gives you information about how to care for yourself after your procedure. Your health care provider may also give you more specific instructions. If you have problems or questions, contact your health care provider. What can I expect after the procedure? After the procedure, the following side effects are common:  Pain or discomfort at the IV site.  Nausea.  Vomiting.  Sore throat.  Trouble concentrating.  Feeling cold or chills.  Weak or tired.  Sleepiness and fatigue.  Soreness and body aches. These side effects can affect parts of the body that were not involved in surgery. Follow these instructions at home:  For at least 24 hours after the procedure:  Have a responsible adult stay with you. It is important to have someone help care for you until you are awake and alert.  Rest as needed.  Do not: ? Participate in activities in which you could fall or become injured. ? Drive. ? Use heavy machinery. ? Drink alcohol. ? Take sleeping pills or medicines that cause drowsiness. ? Make important decisions or sign legal documents. ? Take care of children on your own. Eating and drinking  Follow any instructions from your health care provider about eating or drinking restrictions.  When you feel hungry, start by eating small amounts  of foods that are soft and easy to digest (bland), such as toast. Gradually return to your regular diet.  Drink enough fluid to keep your urine pale yellow.  If you vomit, rehydrate by drinking water, juice, or clear broth. General instructions  If you have sleep apnea, surgery and certain medicines can increase your risk for breathing problems. Follow instructions from your health care provider about wearing your sleep device: ? Anytime you are sleeping, including during daytime  naps. ? While taking prescription pain medicines, sleeping medicines, or medicines that make you drowsy.  Return to your normal activities as told by your health care provider. Ask your health care provider what activities are safe for you.  Take over-the-counter and prescription medicines only as told by your health care provider.  If you smoke, do not smoke without supervision.  Keep all follow-up visits as told by your health care provider. This is important. Contact a health care provider if:  You have nausea or vomiting that does not get better with medicine.  You cannot eat or drink without vomiting.  You have pain that does not get better with medicine.  You are unable to pass urine.  You develop a skin rash.  You have a fever.  You have redness around your IV site that gets worse. Get help right away if:  You have difficulty breathing.  You have chest pain.  You have blood in your urine or stool, or you vomit blood. Summary  After the procedure, it is common to have a sore throat or nausea. It is also common to feel tired.  Have a responsible adult stay with you for the first 24 hours after general anesthesia. It is important to have someone help care for you until you are awake and alert.  When you feel hungry, start by eating small amounts of foods that are soft and easy to digest (bland), such as toast. Gradually return to your regular diet.  Drink enough fluid to keep your urine pale yellow.  Return to your normal activities as told by your health care provider. Ask your health care provider what activities are safe for you. This information is not intended to replace advice given to you by your health care provider. Make sure you discuss any questions you have with your health care provider. Document Revised: 09/30/2017 Document Reviewed: 05/13/2017 Elsevier Patient Education  [place].

## 2020-01-24 ENCOUNTER — Encounter: Payer: Self-pay | Admitting: *Deleted

## 2020-07-23 ENCOUNTER — Other Ambulatory Visit: Payer: Self-pay | Admitting: Podiatry

## 2020-07-23 DIAGNOSIS — M7671 Peroneal tendinitis, right leg: Secondary | ICD-10-CM

## 2020-07-23 DIAGNOSIS — M79671 Pain in right foot: Secondary | ICD-10-CM

## 2020-07-23 DIAGNOSIS — M792 Neuralgia and neuritis, unspecified: Secondary | ICD-10-CM

## 2020-07-23 DIAGNOSIS — M7989 Other specified soft tissue disorders: Secondary | ICD-10-CM

## 2020-08-08 ENCOUNTER — Ambulatory Visit
Admission: RE | Admit: 2020-08-08 | Discharge: 2020-08-08 | Disposition: A | Discharge status: Home / Self Care | Payer: 59 | Source: Ambulatory Visit | Attending: Podiatry | Admitting: Podiatry

## 2020-08-08 ENCOUNTER — Other Ambulatory Visit: Payer: Self-pay

## 2020-08-08 DIAGNOSIS — M79671 Pain in right foot: Secondary | ICD-10-CM | POA: Diagnosis present

## 2020-08-08 DIAGNOSIS — M7989 Other specified soft tissue disorders: Secondary | ICD-10-CM | POA: Diagnosis present

## 2020-08-08 DIAGNOSIS — M7671 Peroneal tendinitis, right leg: Secondary | ICD-10-CM

## 2020-08-08 DIAGNOSIS — M792 Neuralgia and neuritis, unspecified: Secondary | ICD-10-CM | POA: Diagnosis present

## 2020-08-13 DIAGNOSIS — R2689 Other abnormalities of gait and mobility: Secondary | ICD-10-CM | POA: Insufficient documentation

## 2020-08-13 DIAGNOSIS — M79671 Pain in right foot: Secondary | ICD-10-CM | POA: Insufficient documentation

## 2020-08-13 DIAGNOSIS — S86311D Strain of muscle(s) and tendon(s) of peroneal muscle group at lower leg level, right leg, subsequent encounter: Secondary | ICD-10-CM | POA: Insufficient documentation

## 2020-08-13 DIAGNOSIS — S92051K Displaced other extraarticular fracture of right calcaneus, subsequent encounter for fracture with nonunion: Secondary | ICD-10-CM | POA: Insufficient documentation

## 2020-08-13 DIAGNOSIS — M25571 Pain in right ankle and joints of right foot: Secondary | ICD-10-CM | POA: Insufficient documentation

## 2020-08-13 DIAGNOSIS — M9689 Other intraoperative and postprocedural complications and disorders of the musculoskeletal system: Secondary | ICD-10-CM | POA: Insufficient documentation

## 2020-10-14 ENCOUNTER — Inpatient Hospital Stay: Admission: RE | Admit: 2020-10-14 | Payer: 59 | Source: Ambulatory Visit

## 2020-10-14 ENCOUNTER — Other Ambulatory Visit: Payer: Self-pay | Admitting: Podiatry

## 2020-10-21 ENCOUNTER — Encounter
Admission: RE | Admit: 2020-10-21 | Discharge: 2020-10-21 | Disposition: A | Discharge status: Home / Self Care | Payer: 59 | Source: Ambulatory Visit | Attending: Podiatry | Admitting: Podiatry

## 2020-10-21 ENCOUNTER — Other Ambulatory Visit: Payer: Self-pay

## 2020-10-21 NOTE — Patient Instructions (Signed)
Your procedure is scheduled on: 10/24/20 Report to DAY SURGERY DEPARTMENT LOCATED ON 2ND FLOOR MEDICAL MALL ENTRANCE. Must stop at Admitting first To find out your arrival time please call [phone] between 1PM - 3PM on 10/23/20.  Remember: Instructions that are not followed completely may result in serious medical risk, up to and including death, or upon the discretion of your surgeon and anesthesiologist your surgery may need to be rescheduled.     _X__ 1. Do not eat food after midnight the night before your procedure.                 No gum chewing or hard candies. You may drink clear liquids up to 2 hours                 before you are scheduled to arrive for your surgery- DO not drink clear                 liquids within 2 hours of the start of your surgery.                 Clear Liquids include:  water, apple juice without pulp, clear carbohydrate                 drink such as Clearfast or Gatorade, Black Coffee or Tea (Do not add                 anything to coffee or tea). Diabetics water only  __X__2.  On the morning of surgery brush your teeth with toothpaste and water, you                 may rinse your mouth with mouthwash if you wish.  Do not swallow any              toothpaste of mouthwash.     _X__ 3.  No Alcohol for 24 hours before or after surgery.   _X__ 4.  Do Not Smoke or use e-cigarettes For 24 Hours Prior to Your Surgery.                 Do not use any chewable tobacco products for at least 6 hours prior to                 surgery.  ____  5.  Bring all medications with you on the day of surgery if instructed.   __X__  6.  Notify your doctor if there is any change in your medical condition      (cold, fever, infections).     Do not wear jewelry, make-up, hairpins, clips or nail polish. Do not wear lotions, powders, or perfumes.  Do not shave 48 hours prior to surgery. Men may shave face and neck. Do not bring valuables to the hospital.    [facility] is not  responsible for any belongings or valuables.  Contacts, dentures/partials or body piercings may not be worn into surgery. Bring a case for your contacts, glasses or hearing aids, a denture cup will be supplied. Leave your suitcase in the car. After surgery it may be brought to your room. For patients admitted to the hospital, discharge time is determined by your treatment team.   Patients discharged the day of surgery will not be allowed to drive home.   Please read over the following fact sheets that you were given:   MRSA Information, Incentive Spirometer, CHG soap  __X__ Take these medicines the morning  of surgery with A SIP OF WATER:    1. none  2.   3.   4.  5.  6.  ____ Fleet Enema (as directed)   __X__ Use CHG Soap/SAGE wipes as directed  ____ Use inhalers on the day of surgery  ____ Stop metformin/Janumet/Farxiga 2 days prior to surgery    ____ Take 1/2 of usual insulin dose the night before surgery. No insulin the morning          of surgery.   ____ Stop Blood Thinners Coumadin/Plavix/Xarelto/Pleta/Pradaxa/Eliquis/Effient/Aspirin  on   Or contact your Surgeon, Cardiologist or Medical Doctor regarding  ability to stop your blood thinners  __X__ Stop Anti-inflammatories 7 days before surgery such as Advil, Ibuprofen, Motrin,  BC or Goodies Powder, Naprosyn, Naproxen, Aleve, Aspirin    __X__ Stop all herbal supplements, fish oil or vitamin E until after surgery.    ____ Bring C-Pap to the hospital.

## 2020-10-22 ENCOUNTER — Other Ambulatory Visit
Admission: RE | Admit: 2020-10-22 | Discharge: 2020-10-22 | Disposition: A | Discharge status: Home / Self Care | Payer: 59 | Source: Ambulatory Visit | Attending: Podiatry | Admitting: Podiatry

## 2020-10-22 DIAGNOSIS — Z20822 Contact with and (suspected) exposure to covid-19: Secondary | ICD-10-CM | POA: Insufficient documentation

## 2020-10-22 DIAGNOSIS — Z01812 Encounter for preprocedural laboratory examination: Secondary | ICD-10-CM | POA: Insufficient documentation

## 2020-10-22 LAB — SARS CORONAVIRUS 2 (TAT 6-24 HRS): SARS Coronavirus 2: NEGATIVE

## 2020-10-24 ENCOUNTER — Other Ambulatory Visit: Payer: Self-pay

## 2020-10-24 ENCOUNTER — Ambulatory Visit: Payer: 59 | Admitting: Urgent Care

## 2020-10-24 ENCOUNTER — Encounter: Payer: Self-pay | Admitting: Podiatry

## 2020-10-24 ENCOUNTER — Ambulatory Visit
Admission: RE | Admit: 2020-10-24 | Discharge: 2020-10-24 | Disposition: A | Discharge status: Home / Self Care | Payer: 59 | Attending: Podiatry | Admitting: Podiatry

## 2020-10-24 ENCOUNTER — Encounter
Admission: RE | Disposition: A | Discharge status: Home / Self Care | Payer: Self-pay | Source: Home / Self Care | Attending: Podiatry

## 2020-10-24 DIAGNOSIS — Y939 Activity, unspecified: Secondary | ICD-10-CM | POA: Diagnosis not present

## 2020-10-24 DIAGNOSIS — K9041 Non-celiac gluten sensitivity: Secondary | ICD-10-CM | POA: Insufficient documentation

## 2020-10-24 DIAGNOSIS — X58XXXA Exposure to other specified factors, initial encounter: Secondary | ICD-10-CM | POA: Diagnosis not present

## 2020-10-24 DIAGNOSIS — S96811A Strain of other specified muscles and tendons at ankle and foot level, right foot, initial encounter: Secondary | ICD-10-CM | POA: Insufficient documentation

## 2020-10-24 DIAGNOSIS — E739 Lactose intolerance, unspecified: Secondary | ICD-10-CM | POA: Diagnosis not present

## 2020-10-24 DIAGNOSIS — M9689 Other intraoperative and postprocedural complications and disorders of the musculoskeletal system: Secondary | ICD-10-CM | POA: Diagnosis present

## 2020-10-24 DIAGNOSIS — S92001A Unspecified fracture of right calcaneus, initial encounter for closed fracture: Secondary | ICD-10-CM | POA: Insufficient documentation

## 2020-10-24 HISTORY — PX: TENDON REPAIR: SHX5111

## 2020-10-24 SURGERY — TENDON REPAIR
Anesthesia: General | Site: Foot | Laterality: Right

## 2020-10-24 MED ORDER — OXYCODONE HCL 5 MG PO TABS: 5.0000 mg | ORAL_TABLET | Freq: Once | ORAL | Status: DC | PRN

## 2020-10-24 MED ORDER — CEFAZOLIN SODIUM-DEXTROSE 2-4 GM/100ML-% IV SOLN
2.0000 g | INTRAVENOUS | Status: AC
  Administered 2020-10-24: 2 g via INTRAVENOUS

## 2020-10-24 MED ORDER — BUPIVACAINE LIPOSOME 1.3 % IJ SUSP
INTRAMUSCULAR | Status: DC | PRN
  Administered 2020-10-24 (×2): 10 mL

## 2020-10-24 MED ORDER — POVIDONE-IODINE 7.5 % EX SOLN
Freq: Once | CUTANEOUS | Status: DC
  Filled 2020-10-24: qty 118

## 2020-10-24 MED ORDER — FENTANYL CITRATE (PF) 100 MCG/2ML IJ SOLN
INTRAMUSCULAR | Status: DC | PRN
  Administered 2020-10-24 (×2): 50 ug via INTRAVENOUS
  Administered 2020-10-24: 100 ug via INTRAVENOUS

## 2020-10-24 MED ORDER — PROPOFOL 500 MG/50ML IV EMUL
INTRAVENOUS | Status: AC
  Filled 2020-10-24: qty 50

## 2020-10-24 MED ORDER — PHENYLEPHRINE HCL (PRESSORS) 10 MG/ML IV SOLN: INTRAVENOUS | Status: DC | PRN

## 2020-10-24 MED ORDER — LIDOCAINE HCL (CARDIAC) PF 100 MG/5ML IV SOSY
PREFILLED_SYRINGE | INTRAVENOUS | Status: DC | PRN
  Administered 2020-10-24: 100 mg via INTRATRACHEAL

## 2020-10-24 MED ORDER — FAMOTIDINE 20 MG PO TABS
ORAL_TABLET | ORAL | Status: AC
  Administered 2020-10-24: 20 mg via ORAL
  Filled 2020-10-24: qty 1

## 2020-10-24 MED ORDER — METOCLOPRAMIDE HCL 5 MG/ML IJ SOLN: 5.0000 mg | Freq: Three times a day (TID) | INTRAMUSCULAR | Status: DC | PRN

## 2020-10-24 MED ORDER — ORAL CARE MOUTH RINSE: 15.0000 mL | Freq: Once | OROMUCOSAL | Status: AC

## 2020-10-24 MED ORDER — ACETAMINOPHEN 10 MG/ML IV SOLN
INTRAVENOUS | Status: DC | PRN
  Administered 2020-10-24: 1000 mg via INTRAVENOUS

## 2020-10-24 MED ORDER — LACTATED RINGERS IV SOLN: INTRAVENOUS | Status: DC

## 2020-10-24 MED ORDER — MIDAZOLAM HCL 2 MG/2ML IJ SOLN
INTRAMUSCULAR | Status: AC
  Filled 2020-10-24: qty 2

## 2020-10-24 MED ORDER — FAMOTIDINE 20 MG PO TABS: 20.0000 mg | ORAL_TABLET | Freq: Once | ORAL | Status: AC

## 2020-10-24 MED ORDER — OXYCODONE-ACETAMINOPHEN 5-325 MG PO TABS: 1.0000 | ORAL_TABLET | Freq: Four times a day (QID) | ORAL | 0 refills | Status: DC | PRN

## 2020-10-24 MED ORDER — GLYCOPYRROLATE 0.2 MG/ML IJ SOLN
INTRAMUSCULAR | Status: DC | PRN
  Administered 2020-10-24: .2 mg via INTRAVENOUS

## 2020-10-24 MED ORDER — ONDANSETRON HCL 4 MG/2ML IJ SOLN: 4.0000 mg | Freq: Four times a day (QID) | INTRAMUSCULAR | Status: DC | PRN

## 2020-10-24 MED ORDER — KETOROLAC TROMETHAMINE 30 MG/ML IJ SOLN
INTRAMUSCULAR | Status: DC | PRN
  Administered 2020-10-24: 30 mg via INTRAVENOUS

## 2020-10-24 MED ORDER — PROPOFOL 10 MG/ML IV BOLUS
INTRAVENOUS | Status: AC
  Filled 2020-10-24: qty 20

## 2020-10-24 MED ORDER — FENTANYL CITRATE (PF) 100 MCG/2ML IJ SOLN
INTRAMUSCULAR | Status: AC
  Filled 2020-10-24: qty 2

## 2020-10-24 MED ORDER — BUPIVACAINE HCL (PF) 0.25 % IJ SOLN
INTRAMUSCULAR | Status: AC
  Filled 2020-10-24: qty 30

## 2020-10-24 MED ORDER — CHLORHEXIDINE GLUCONATE 0.12 % MT SOLN
OROMUCOSAL | Status: AC
  Administered 2020-10-24: 15 mL via OROMUCOSAL
  Filled 2020-10-24: qty 15

## 2020-10-24 MED ORDER — MIDAZOLAM HCL 2 MG/2ML IJ SOLN
INTRAMUSCULAR | Status: DC | PRN
  Administered 2020-10-24: 2 mg via INTRAVENOUS

## 2020-10-24 MED ORDER — CEFAZOLIN SODIUM-DEXTROSE 2-4 GM/100ML-% IV SOLN
INTRAVENOUS | Status: AC
  Filled 2020-10-24: qty 100

## 2020-10-24 MED ORDER — ONDANSETRON HCL 4 MG PO TABS: 4.0000 mg | ORAL_TABLET | Freq: Four times a day (QID) | ORAL | Status: DC | PRN

## 2020-10-24 MED ORDER — CHLORHEXIDINE GLUCONATE 0.12 % MT SOLN: 15.0000 mL | Freq: Once | OROMUCOSAL | Status: AC

## 2020-10-24 MED ORDER — SODIUM CHLORIDE 0.9 % IV SOLN
INTRAVENOUS | Status: DC | PRN
  Administered 2020-10-24: 25 ug/min via INTRAVENOUS

## 2020-10-24 MED ORDER — BUPIVACAINE LIPOSOME 1.3 % IJ SUSP
INTRAMUSCULAR | Status: AC
  Filled 2020-10-24: qty 20

## 2020-10-24 MED ORDER — PROMETHAZINE HCL 25 MG/ML IJ SOLN: 6.2500 mg | INTRAMUSCULAR | Status: DC | PRN

## 2020-10-24 MED ORDER — ONDANSETRON HCL 4 MG/2ML IJ SOLN
INTRAMUSCULAR | Status: DC | PRN
  Administered 2020-10-24 (×2): 4 mg via INTRAVENOUS

## 2020-10-24 MED ORDER — BUPIVACAINE HCL (PF) 0.5 % IJ SOLN
INTRAMUSCULAR | Status: AC
  Filled 2020-10-24: qty 30

## 2020-10-24 MED ORDER — FENTANYL CITRATE (PF) 100 MCG/2ML IJ SOLN: 25.0000 ug | INTRAMUSCULAR | Status: DC | PRN

## 2020-10-24 MED ORDER — DEXAMETHASONE SODIUM PHOSPHATE 10 MG/ML IJ SOLN
INTRAMUSCULAR | Status: DC | PRN
  Administered 2020-10-24: 10 mg via INTRAVENOUS

## 2020-10-24 MED ORDER — ROCURONIUM BROMIDE 100 MG/10ML IV SOLN
INTRAVENOUS | Status: DC | PRN
  Administered 2020-10-24: 10 mg via INTRAVENOUS

## 2020-10-24 MED ORDER — BUPIVACAINE HCL (PF) 0.25 % IJ SOLN
INTRAMUSCULAR | Status: DC | PRN
  Administered 2020-10-24 (×2): 10 mL

## 2020-10-24 MED ORDER — DEXMEDETOMIDINE (PRECEDEX) IN NS 20 MCG/5ML (4 MCG/ML) IV SYRINGE
PREFILLED_SYRINGE | INTRAVENOUS | Status: DC | PRN
  Administered 2020-10-24: 16 ug via INTRAVENOUS
  Administered 2020-10-24: 4 ug via INTRAVENOUS

## 2020-10-24 MED ORDER — PROPOFOL 10 MG/ML IV BOLUS
INTRAVENOUS | Status: DC | PRN
  Administered 2020-10-24: 50 mg via INTRAVENOUS
  Administered 2020-10-24: 150 mg via INTRAVENOUS

## 2020-10-24 MED ORDER — METOCLOPRAMIDE HCL 10 MG PO TABS: 5.0000 mg | ORAL_TABLET | Freq: Three times a day (TID) | ORAL | Status: DC | PRN

## 2020-10-24 MED ORDER — SUCCINYLCHOLINE CHLORIDE 20 MG/ML IJ SOLN
INTRAMUSCULAR | Status: DC | PRN
  Administered 2020-10-24: 100 mg via INTRAVENOUS

## 2020-10-24 MED ORDER — PROPOFOL 500 MG/50ML IV EMUL
INTRAVENOUS | Status: DC | PRN
  Administered 2020-10-24: 175 ug/kg/min via INTRAVENOUS

## 2020-10-24 MED ORDER — OXYCODONE HCL 5 MG/5ML PO SOLN: 5.0000 mg | Freq: Once | ORAL | Status: DC | PRN

## 2020-10-24 MED ORDER — LIDOCAINE HCL (PF) 1 % IJ SOLN
INTRAMUSCULAR | Status: AC
  Filled 2020-10-24: qty 30

## 2020-10-24 MED ORDER — ACETAMINOPHEN 10 MG/ML IV SOLN
INTRAVENOUS | Status: AC
  Filled 2020-10-24: qty 100

## 2020-10-24 SURGICAL SUPPLY — 80 items
ANCHOR 4.5 FOOTPRINT ULTRA (Anchor) IMPLANT
APL SKNCLS STERI-STRIP NONHPOA (GAUZE/BANDAGES/DRESSINGS) ×1
BENZOIN TINCTURE PRP APPL 2/3 (GAUZE/BANDAGES/DRESSINGS) ×1 IMPLANT
BIT DRILL SOLID 2.0 X 110MM (DRILL) IMPLANT
BLADE OSC/SAGITTAL 5.5X25 (BLADE) ×1 IMPLANT
BLADE SURG 15 STRL LF DISP TIS (BLADE) ×1 IMPLANT
BLADE SURG 15 STRL SS (BLADE) ×2
BLADE SURG MINI STRL (BLADE) ×2 IMPLANT
BNDG CMPR STD VLCR NS LF 5.8X4 (GAUZE/BANDAGES/DRESSINGS) ×2
BNDG CONFORM 2 STRL LF (GAUZE/BANDAGES/DRESSINGS) ×2 IMPLANT
BNDG CONFORM 3 STRL LF (GAUZE/BANDAGES/DRESSINGS) ×2 IMPLANT
BNDG ELASTIC 4X5.8 VLCR NS LF (GAUZE/BANDAGES/DRESSINGS) ×4 IMPLANT
BNDG ESMARK 4X12 TAN STRL LF (GAUZE/BANDAGES/DRESSINGS) ×2 IMPLANT
BNDG ESMARK 6X12 TAN STRL LF (GAUZE/BANDAGES/DRESSINGS) ×1 IMPLANT
BNDG GAUZE 4.5X4.1 6PLY STRL (MISCELLANEOUS) ×2 IMPLANT
BONE CANC CHIPS 20CC PCAN1/4 (Bone Implant) ×2 IMPLANT
BOOT STEPPER DURA LG (SOFTGOODS) ×1 IMPLANT
CANISTER SUCT 1200ML W/VALVE (MISCELLANEOUS) IMPLANT
CHIPS CANC BONE 20CC PCAN1/4 (Bone Implant) ×1 IMPLANT
COVER WAND RF STERILE (DRAPES) ×1 IMPLANT
DRAPE FLUOR MINI C-ARM 54X84 (DRAPES) ×2 IMPLANT
DRILL SOLID 2.0 X 110MM (DRILL) ×2
DURAPREP 26ML APPLICATOR (WOUND CARE) ×2 IMPLANT
ELECT REM PT RETURN 9FT ADLT (ELECTROSURGICAL) ×2
ELECTRODE REM PT RTRN 9FT ADLT (ELECTROSURGICAL) ×1 IMPLANT
GAUZE 4X4 16PLY RFD (DISPOSABLE) ×1 IMPLANT
GAUZE SPONGE 4X4 12PLY STRL (GAUZE/BANDAGES/DRESSINGS) ×2 IMPLANT
GAUZE XEROFORM 1X8 LF (GAUZE/BANDAGES/DRESSINGS) ×2 IMPLANT
GLOVE BIO SURGEON STRL SZ7.5 (GLOVE) ×2 IMPLANT
GLOVE INDICATOR 8.0 STRL GRN (GLOVE) ×2 IMPLANT
GOWN STRL REUS W/ TWL XL LVL3 (GOWN DISPOSABLE) ×2 IMPLANT
GOWN STRL REUS W/TWL XL LVL3 (GOWN DISPOSABLE) ×4
GRAFT BNE CANC CHIPS 1-8 20CC (Bone Implant) IMPLANT
GRAFT TRIN ELITE MED MUSC TRAN (Graft) ×1 IMPLANT
HANDLE YANKAUER SUCT BULB TIP (MISCELLANEOUS) ×2 IMPLANT
K-WIRE SMOOTH TROCAR 2.0X150 (WIRE) ×2
KIT TURNOVER KIT A (KITS) ×2 IMPLANT
KWIRE SMOOTH TROCAR 2.0X150 (WIRE) IMPLANT
LABEL OR SOLS (LABEL) IMPLANT
MANIFOLD NEPTUNE II (INSTRUMENTS) ×2 IMPLANT
NDL 18GX1X1/2 (RX/OR ONLY) (NEEDLE) ×1 IMPLANT
NDL FILTER BLUNT 18X1 1/2 (NEEDLE) ×1 IMPLANT
NDL HYPO 25X1 1.5 SAFETY (NEEDLE) ×3 IMPLANT
NDL MAYO CATGUT SZ5 (NEEDLE)
NDL SUT 5 .5 CRC TPR PNT MAYO (NEEDLE) ×1 IMPLANT
NEEDLE 18GX1X1/2 (RX/OR ONLY) (NEEDLE) ×2 IMPLANT
NEEDLE FILTER BLUNT 18X 1/2SAF (NEEDLE) ×1
NEEDLE FILTER BLUNT 18X1 1/2 (NEEDLE) ×1 IMPLANT
NEEDLE HYPO 25X1 1.5 SAFETY (NEEDLE) ×6 IMPLANT
NS IRRIG 500ML POUR BTL (IV SOLUTION) ×2 IMPLANT
PACK EXTREMITY ARMC (MISCELLANEOUS) ×2 IMPLANT
PLATE COMPR DOGBONE 25 (Plate) ×1 IMPLANT
RASP SM TEAR CROSS CUT (RASP) IMPLANT
SCREW  2.7X32 NONLOCK (Screw) ×1 IMPLANT
SCREW 2.7X32 NONLOCK (Screw) IMPLANT
SCREW LOCK PLATE R3 2.7X18 (Screw) ×2 IMPLANT
SCREW LOCK PLATE R3 2.7X20 (Screw) ×2 IMPLANT
SPLINT CAST 1 STEP 5X30 WHT (MISCELLANEOUS) ×1 IMPLANT
SPLINT FAST PLASTER 5X30 (CAST SUPPLIES)
SPLINT PLASTER CAST FAST 5X30 (CAST SUPPLIES) ×1 IMPLANT
SPONGE LAP 18X18 RF (DISPOSABLE) ×1 IMPLANT
STOCKINETTE M/LG 89821 (MISCELLANEOUS) ×2 IMPLANT
STRIP CLOSURE SKIN 1/2X4 (GAUZE/BANDAGES/DRESSINGS) ×2 IMPLANT
STRIP CLOSURE SKIN 1/4X4 (GAUZE/BANDAGES/DRESSINGS) ×1 IMPLANT
SUT MNCRL 4-0 VIOLET P-3 (SUTURE) ×1 IMPLANT
SUT MONOCRYL 4-0 (SUTURE) ×1
SUT PDS AB 0 CT1 27 (SUTURE) IMPLANT
SUT VIC AB 0 SH 27 (SUTURE) ×2 IMPLANT
SUT VIC AB 2-0 SH 27 (SUTURE) ×6
SUT VIC AB 2-0 SH 27XBRD (SUTURE) ×2 IMPLANT
SUT VIC AB 3-0 SH 27 (SUTURE) ×4
SUT VIC AB 3-0 SH 27X BRD (SUTURE) ×1 IMPLANT
SUT VIC AB 4-0 FS2 27 (SUTURE) ×2 IMPLANT
SUT VICRYL AB 3-0 FS1 BRD 27IN (SUTURE) ×2 IMPLANT
SWABSTK COMLB BENZOIN TINCTURE (MISCELLANEOUS) ×2 IMPLANT
SYR 10ML LL (SYRINGE) ×4 IMPLANT
SYR 3ML LL SCALE MARK (SYRINGE) ×2 IMPLANT
WAND TOPAZ MICRO DEBRIDER (MISCELLANEOUS) ×1 IMPLANT
WIRE MAGNUM (SUTURE) ×1 IMPLANT
WIRE OLIVE SMOOTH 1.4MMX60MM (WIRE) ×3 IMPLANT

## 2020-10-24 NOTE — Discharge Instructions (Addendum)
[facility]  POST OPERATIVE INSTRUCTIONS FOR DR. TROXLER, DR. Ether Griffins, AND DR. BAKER [facility]   1. Take your medication as prescribed.  Pain medication should be taken only as needed.  2. Keep the dressing clean, dry and intact.  3. Keep your foot elevated above the heart level for the first 48 hours.  4. We have instructed you to be non-weight bearing.  5. Always wear your post-op shoe when walking.  Always use your crutches if you are to be non-weight bearing.  6. Do not take a shower. Baths are permissible as long as the foot is kept out of the water.   7. Every hour you are awake:  - Bend your knee 15 times.  8. Call [facility] [phone]) if any of the following problems occur: - You develop a temperature or fever. - The bandage becomes saturated with blood. - Medication does not stop your pain. - Injury of the foot occurs. - Any symptoms of infection including redness, odor, or red streaks running from wound.   Bupivacaine Liposomal Suspension for Injection What is this medicine? BUPIVACAINE LIPOSOMAL (bue PIV a kane LIP oh som al) is an anesthetic. It causes loss of feeling in the skin or other tissues. It is used to prevent and to treat pain from some procedures. This medicine may be used for other purposes; ask your health care provider or pharmacist if you have questions. COMMON BRAND NAME(S): EXPAREL What should I tell my health care provider before I take this medicine? They need to know if you have any of these conditions:  G6PD deficiency  heart disease  kidney disease  liver disease  low blood pressure  lung or breathing disease, like asthma  an unusual or allergic reaction to bupivacaine, other medicines, foods, dyes, or preservatives  pregnant or trying to get pregnant  breast-feeding How should I use this medicine? This medicine is injected into the affected area. It  is given by a health care provider in a hospital or clinic setting. Talk to your health care provider about the use of this medicine in children. While it may be given to children as young as 6 years for selected conditions, precautions do apply. Overdosage: If you think you have taken too much of this medicine contact a poison control center or emergency room at once. NOTE: This medicine is only for you. Do not share this medicine with others. What if I miss a dose? This does not apply. What may interact with this medicine? This medicine may interact with the following medications:  acetaminophen  certain antibiotics like dapsone, nitrofurantoin, aminosalicylic acid, sulfonamides  certain medicines for seizures like phenobarbital, phenytoin, valproic acid  chloroquine  cyclophosphamide  flutamide  hydroxyurea  ifosfamide  metoclopramide  nitric oxide  nitroglycerin  nitroprusside  nitrous oxide  other local anesthetics like lidocaine, pramoxine, tetracaine  primaquine  quinine  rasburicase  sulfasalazine This list may not describe all possible interactions. Give your health care provider a list of all the medicines, herbs, non-prescription drugs, or dietary supplements you use. Also tell them if you smoke, drink alcohol, or use illegal drugs. Some items may interact with your medicine. What should I watch for while using this medicine? Your condition will be monitored carefully while you are receiving this medicine. Be careful to avoid injury while the area is numb, and you are not aware of pain. What side effects may I notice from receiving this medicine? Side  effects that you should report to your doctor or health care professional as soon as possible:  allergic reactions like skin rash, itching or hives, swelling of the face, lips, or tongue  seizures  signs and symptoms of a dangerous change in heartbeat or heart rhythm like chest pain; dizziness; fast,  irregular heartbeat; palpitations; feeling faint or lightheaded; falls; breathing problems  signs and symptoms of methemoglobinemia such as pale, gray, or blue colored skin; headache; fast heartbeat; shortness of breath; feeling faint or lightheaded, falls; tiredness Side effects that usually do not require medical attention (report to your doctor or health care professional if they continue or are bothersome):  anxious  back pain  changes in taste  changes in vision  constipation  dizziness  fever  nausea, vomiting This list may not describe all possible side effects. Call your doctor for medical advice about side effects. You may report side effects to FDA at 1-800-FDA-1088. Where should I keep my medicine? This drug is given in a hospital or clinic and will not be stored at home. NOTE: This sheet is a summary. It may not cover all possible information. If you have questions about this medicine, talk to your doctor, pharmacist, or health care provider.  2021 Elsevier/Gold Standard (2020-01-03 12:24:57)    AMBULATORY SURGERY  DISCHARGE INSTRUCTIONS   1) The drugs that you were given will stay in your system until tomorrow so for the next 24 hours you should not:  A) Drive an automobile B) Make any legal decisions C) Drink any alcoholic beverage   2) You may resume regular meals tomorrow.  Today it is better to start with liquids and gradually work up to solid foods.  You may eat anything you prefer, but it is better to start with liquids, then soup and crackers, and gradually work up to solid foods.   3) Please notify your doctor immediately if you have any unusual bleeding, trouble breathing, redness and pain at the surgery site, drainage, fever, or pain not relieved by medication.    4) Additional Instructions:        Please contact your physician with any problems or Same Day Surgery at [phone], Monday through Friday 6 am to 4 pm, or [place] at  [facility] number at [phone].

## 2020-10-24 NOTE — Anesthesia Procedure Notes (Signed)
Procedure Name: Intubation Performed by: Fletcher-Harrison, Bay Wayson, CRNA Pre-anesthesia Checklist: Patient identified, Emergency Drugs available, Suction available and Patient being monitored Patient Re-evaluated:Patient Re-evaluated prior to induction Oxygen Delivery Method: Circle system utilized Preoxygenation: Pre-oxygenation with 100% oxygen Induction Type: IV induction Ventilation: Mask ventilation without difficulty Tube type: Oral Tube size: 7.0 mm Number of attempts: 1 Airway Equipment and Method: Stylet and Oral airway Placement Confirmation: ETT inserted through vocal cords under direct vision,  positive ETCO2,  breath sounds checked- equal and bilateral and CO2 detector Secured at: 21 cm Tube secured with: Tape Dental Injury: Teeth and Oropharynx as per pre-operative assessment        

## 2020-10-24 NOTE — Op Note (Signed)
Operative note   Surgeon:Ossie Yebra [employer] logistics/support/administrative officer: None    Preop diagnosis: 1.  Nonunion right calcaneus Evans osteotomy 2.  Peroneal tendon tear    Postop diagnosis: 1.  Nonunion right calcaneal Evans osteotomy 2 peroneal tenosynovitis 2 peroneus longus and brevis    Procedure: 1.  Repair nonunion with posterior calcaneal bone graft right heel 2.  Peroneal longus tenolysis right ankle 3.  Peroneal brevis tenolysis right ankle    EBL: Minimal    Anesthesia:local and general.  Local consisted of a total of 20 cc of Exparel long-acting anesthetic and 20 cc of 0.25% bupivacaine    Hemostasis: Mid calf tourniquet inflated to 200 mmHg for 120 minutes    Specimen: None    Complications: None    Operative indications:Ardyth Wisdom is an 65 y.o. that presents today for surgical intervention.  The risks/benefits/alternatives/complications have been discussed and consent has been given.    Procedure:  Patient was brought into the OR and placed on the operating table in thesupine position. After anesthesia was obtained theright lower extremity was prepped and draped in usual sterile fashion.  Attention was directed to the lateral aspect of the right foot where longitudinal incision was performed from posterior aspect of the ankle curving around the distal fibula to the level of the fifth metatarsal base region.  Sharp and blunt dissection carried down to the peroneal tendon sheath.  This was then entered and opened up.  Noted tenosynovitis is found within the sheath itself.  The peroneal longus and brevis tendon were then evaluated.  No obvious severe tear was noted but definite tenolysis along the tendon issues were noted.  These were then excised and removed from the surgical field in toto.  Further dissection was taken down to the level of the lateral calcaneus where subperiosteal dissection revealed the nonunion at the Evans calcaneal osteotomy site.  There was no incorporation of the previous  bone grafting within this area with free motion of the distal fragment.  At this time all of the nonunion was taken down with a combination of a curette and rongeur to healthy normal bone.  At this time the dissection was taken back posterior to the peroneal tendons to the posterior aspect of the calcaneus.  The posterior superior aspect of the calcaneus was then evaluated.  Next a small wedge of posterior superior lateral calcaneus was removed posterior to the subtalar joint.  A 20 mm x 5 mm wedge of bone was removed.  The area of removal of bone graft was then backfilled with cancellous bone chips at this time.  At this time attention was redirected to the evidence site.  This was distracted out.  The nonunion site was prepped with a 2.0 mm drill on both sides.  The cortical cancellous calcaneal bone graft was then placed into the nonunion site.  This was then supplemented with Trinity bone graft.  At this time good stability was noted.  The capital fragment of the distal evidence site was then position back a little bit more plantarflexed in a more normal position.  Next a small dog bone plate was placed laterally with 2 screws in the distal fragment site.  A compression screw was placed just proximal to the nonunion site into screws were placed proximal for stability.  Screws were 2.5 mm in size.  Good stability and alignment was noted in all planes.  The wound was then flushed with copious amounts of irrigation.  Peroneal tendons were then  infiltrated with a Topaz wand.  Closure of the peroneal sheath and peroneal retinaculum was then performed with a 2-0 Vicryl.  The subcutaneous tissue was then closed with 3-0 Vicryl and the skin closed with a 3-0 nylon.  A bulky sterile padded dressing was applied and patient was then placed in an equalizer walker boot with the foot in a neutral 90 degree position.    Patient tolerated the procedure and anesthesia well.  Was transported from the OR to the PACU with all  vital signs stable and vascular status intact. To be discharged per routine protocol.  Will follow up in approximately 1 week in the outpatient clinic.

## 2020-10-24 NOTE — Anesthesia Preprocedure Evaluation (Signed)
Anesthesia Evaluation  Patient identified by MRN, date of birth, ID band Patient awake    Reviewed: Allergy & Precautions, H&P , NPO status , Patient's Chart, lab work & pertinent test results  History of Anesthesia Complications (+) PONV and history of anesthetic complications  Airway Mallampati: III  TM Distance: <3 FB Neck ROM: full    Dental  (+) Chipped   Pulmonary neg pulmonary ROS, neg shortness of breath,    Pulmonary exam normal        Cardiovascular Exercise Tolerance: Good (-) angina(-) Past MI and (-) DOE negative cardio ROS Normal cardiovascular exam     Neuro/Psych negative neurological ROS  negative psych ROS   GI/Hepatic negative GI ROS, Neg liver ROS, neg GERD  ,  Endo/Other  negative endocrine ROS  Renal/GU      Musculoskeletal  (+) Arthritis ,   Abdominal   Peds  Hematology negative hematology ROS (+)   Anesthesia Other Findings Past Medical History: 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 No date: Arthritis     Comment:  knees No date: PONV (postoperative nausea and vomiting)     Comment:  after D&C and Cholecystectomy  Past Surgical History: No date: BUNIONECTOMY; Right 01/22/2020: BUNIONECTOMY; Right     Comment:  Procedure: BUNIONECTOMY LAPIDUS TYPE + AKIN RIGHT;                Surgeon: Rosetta Posner, DPM;  Location: [facility];  Service: Podiatry;  Laterality: Right;  pre op               anes. pop/saph block No date: CHOLECYSTECTOMY No date: COLONOSCOPY No date: DILATION AND CURETTAGE OF UTERUS 01/22/2020: FLAT FOOT CORRECTION; Right     Comment:  Procedure: EVANS/MCDO;  Surgeon: Rosetta Posner, DPM;                Location: [facility];  Service: Podiatry;                Laterality: Right; 01/22/2020: HAMMER TOE SURGERY; Right     Comment:  Procedure: HAMMER TOE  CORRECTION;  Surgeon: Rosetta Posner, DPM;  Location: [facility];  Service:               Podiatry;  Laterality: Right;  BMI    Body Mass Index: 34.82 kg/m      Reproductive/Obstetrics negative OB ROS                             Anesthesia Physical Anesthesia Plan  ASA: III  Anesthesia Plan: General ETT   Post-op Pain Management:    Induction: Intravenous  PONV Risk Score and Plan: Ondansetron, Dexamethasone, Midazolam, Treatment may vary due to age or medical condition, TIVA and Propofol infusion  Airway Management Planned: Oral ETT  Additional Equipment:   Intra-op Plan:   Post-operative Plan: Extubation in OR  Informed Consent: I have reviewed the patients History and Physical, chart, labs and discussed the procedure including the risks, benefits and alternatives for the proposed anesthesia with the patient or authorized representative who has indicated his/her understanding and  acceptance.     Dental Advisory Given  Plan Discussed with: Anesthesiologist, CRNA and Surgeon  Anesthesia Plan Comments: (Patient consented for risks of anesthesia including but not limited to:  - adverse reactions to medications - damage to eyes, teeth, lips or other oral mucosa - nerve damage due to positioning  - sore throat or hoarseness - Damage to heart, brain, nerves, lungs, other parts of body or loss of life  Patient voiced understanding.)        Anesthesia Quick Evaluation

## 2020-10-24 NOTE — Transfer of Care (Signed)
Immediate Anesthesia Transfer of Care Note  Patient: Tammy Vang  Procedure(s) Performed: FLEXOR TENDON REPAIR SECONDARY RIGHT; NONUNION REPAIR SECONDARDY RIGHT (Right Foot)  Patient Location: PACU  Anesthesia Type:General  Level of Consciousness: drowsy, patient cooperative and responds to stimulation  Airway & Oxygen Therapy: Patient Spontanous Breathing and Patient connected to face mask oxygen  Post-op Assessment: Report given to RN and Post -op Vital signs reviewed and stable  Post vital signs: Reviewed and stable  Last Vitals:  Vitals Value Taken Time  BP 102/71 10/24/20 1250  Temp    Pulse 99 10/24/20 1254  Resp 15 10/24/20 1254  SpO2 94 % 10/24/20 1254  Vitals shown include unvalidated device data.  Last Pain:  Vitals:   10/24/20 0843  TempSrc: Temporal  PainSc: 0-No pain         Complications: No complications documented.

## 2020-10-24 NOTE — Anesthesia Postprocedure Evaluation (Signed)
Anesthesia Post Note  Patient: Tammy Vang  Procedure(s) Performed: FLEXOR TENDON REPAIR SECONDARY RIGHT; NONUNION REPAIR SECONDARDY RIGHT (Right Foot)  Patient location during evaluation: PACU Anesthesia Type: General Level of consciousness: awake and alert Pain management: pain level controlled Vital Signs Assessment: post-procedure vital signs reviewed and stable Respiratory status: spontaneous breathing, nonlabored ventilation, respiratory function stable and patient connected to nasal cannula oxygen Cardiovascular status: blood pressure returned to baseline and stable Postop Assessment: no apparent nausea or vomiting Anesthetic complications: no   No complications documented.   Last Vitals:  Vitals:   10/24/20 1350 10/24/20 1357  BP: 104/83   Pulse: 85 87  Resp: 11 11  Temp:    SpO2: 96% 95%    Last Pain:  Vitals:   10/24/20 1320  TempSrc:   PainSc: 0-No pain                 Cleda Mccreedy Kaleia Longhi

## 2020-10-24 NOTE — H&P (Signed)
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  The various methods of treatment have been discussed with the patient.  No guarantees were given.  After consideration of risks, benefits and other options for treatment, the patient has consented to surgery.  I have reviewed the patients' chart and labs.     A history and physical examination was performed in my office.  The patient was reexamined.  There have been no changes to this history and physical examination.  [name] A

## 2021-08-04 ENCOUNTER — Encounter (INDEPENDENT_AMBULATORY_CARE_PROVIDER_SITE_OTHER): Payer: Self-pay

## 2021-08-04 ENCOUNTER — Other Ambulatory Visit: Payer: Self-pay

## 2021-08-04 ENCOUNTER — Ambulatory Visit: Payer: 59 | Admitting: Podiatry

## 2021-08-04 ENCOUNTER — Ambulatory Visit: Payer: 59

## 2021-08-04 DIAGNOSIS — M7671 Peroneal tendinitis, right leg: Secondary | ICD-10-CM

## 2021-08-04 DIAGNOSIS — S9030XA Contusion of unspecified foot, initial encounter: Secondary | ICD-10-CM

## 2021-08-04 MED ORDER — MELOXICAM 15 MG PO TABS: 15.0000 mg | ORAL_TABLET | Freq: Every day | ORAL | 1 refills | Status: DC

## 2021-08-16 NOTE — Progress Notes (Signed)
HPI: 65 y.o. female presenting today for second opinion regarding pain and tenderness to the right lower extremity.  Patient states that she has a history of multiple surgeries.  Course of events: -1st surgery April 2021: [facility]. Dr. Excell Seltzer.  Bunion.  Hammertoes 2-5.  Belal Scallon calcaneal osteotomy.  Patient states that she never improved from the original surgery.  She continued to have pain and tenderness to the lateral aspect of the foot. -MRI WO CONTRAST RT ANKLE 08/08/2020: IMPRESSION: 1. Surgical changes involving the calcaneus with a vertical/longitudinal osteotomy through the middle facet region of the calcaneus. The osteotomy site is ununited and displaced dorsally. Moderate surrounding marrow edema. 2. Significant sinus tarsi narrowing by the dorsal displacement of the distal calcaneal osteotomy fragment. 3. Focal significant tendinopathy involving the peroneus brevis tendon with suspected extensive interstitial tear along the lateral margin of the distal calcaneus. No complete tear/rupture. 4. Intact medial and lateral ankle ligaments and medial, lateral and anterior ankle tendons. -2nd surgery January 2022: [facility].  Dr. Ether Griffins.  Breanah Faddis calcaneal osteotomy revision.  Anticipated peroneal tendon repair however no tendon was repaired intraoperatively  Patient states that she went to get a second opinion from another surgeon and the surgeon had recommended ankle fusion.  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   Arthritis    knees   PONV (postoperative nausea and vomiting)    after D&C and Cholecystectomy     Physical Exam: General: The patient is alert and oriented x3 in no acute distress.  Dermatology: Skin is warm, dry and supple bilateral lower extremities.  Negative for open lesions or macerations.  Skin incisions of healed  Vascular: Palpable pedal pulses bilaterally. No edema or erythema noted. Capillary refill within normal limits.  Neurological: Epicritic and protective threshold grossly intact bilaterally.   Musculoskeletal Exam: Pain on palpation along the peroneal tendons as a crosses the ankle joint just posterior to the lateral malleolus.  Radiographic Exam:  Patient declined x-rays today  Assessment: 1.  Peroneal tendinitis right 2.  History of right foot surgery.  Please see above   Plan of Care:  1. Patient evaluated. 2.  My recommendation for the patient today was to pursue all conservative treatment options prior to an ankle arthrodesis surgery.  I do feel that an ankle arthrodesis surgery is very aggressive given the patient's current symptoms today.  Patient does not have pain along the bunion or hammertoe sites.  Her only symptoms are along the peroneal tendons of the right lower extremity. 3.  Patient is currently not taking any anti-inflammatories.  Prescription for meloxicam 15 mg daily 4.  The patient has not been immobilized in the cam boot for several months apparently.  Cam boot dispensed.  Weightbearing as tolerated x4 weeks 5.  The patient also has not pursued physical therapy recently.  Order placed for physical therapy at [facility] 6.  Return to clinic in 4 weeks.  Patient will need follow-up x-rays at this time      Felecia Shelling, DPM [facility]  Dr. Felecia Shelling, DPM    [address]  [place], [place] [address]                Office [phone]  Fax [phone]

## 2021-09-04 ENCOUNTER — Ambulatory Visit: Payer: 59 | Admitting: Podiatry

## 2021-09-15 ENCOUNTER — Ambulatory Visit: Payer: 59

## 2021-09-15 ENCOUNTER — Other Ambulatory Visit: Payer: Self-pay

## 2021-09-15 ENCOUNTER — Ambulatory Visit: Payer: 59 | Admitting: Podiatry

## 2021-09-15 DIAGNOSIS — M7671 Peroneal tendinitis, right leg: Secondary | ICD-10-CM

## 2021-09-15 NOTE — Progress Notes (Signed)
HPI: 65 y.o. female presenting today for follow-up evaluation of pain and tenderness to the right lower extremity.  Patient says that there is significant improvement over the past 6 weeks.  She is doing very well and physical therapy is helping.  She presents for follow-up treatment and evaluation   Patient has a history of multiple surgeries.  Course of events: -1st surgery April 2021: [facility]. Dr. Excell Seltzer.  Bunion.  Hammertoes 2-5.  Myeshia Fojtik calcaneal osteotomy.  Patient states that she never improved from the original surgery.  She continued to have pain and tenderness to the lateral aspect of the foot. -MRI WO CONTRAST RT ANKLE 08/08/2020: IMPRESSION: 1. Surgical changes involving the calcaneus with a vertical/longitudinal osteotomy through the middle facet region of the calcaneus. The osteotomy site is ununited and displaced dorsally. Moderate surrounding marrow edema. 2. Significant sinus tarsi narrowing by the dorsal displacement of the distal calcaneal osteotomy fragment. 3. Focal significant tendinopathy involving the peroneus brevis tendon with suspected extensive interstitial tear along the lateral margin of the distal calcaneus. No complete tear/rupture. 4. Intact medial and lateral ankle ligaments and medial, lateral and anterior ankle tendons. -2nd surgery January 2022: [facility].  Dr. Ether Griffins.  Lynde Ludwig calcaneal osteotomy revision.  Anticipated peroneal tendon repair however no tendon was repaired intraoperatively  Patient states that she went to get a second opinion from another surgeon and the surgeon had recommended ankle fusion.  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   Arthritis    knees   PONV (postoperative nausea and vomiting)    after D&C and Cholecystectomy      Physical Exam: General: The patient is alert and oriented x3 in no acute distress.  Dermatology: Skin is warm, dry and supple bilateral lower extremities. Negative for open lesions or macerations.  Skin incisions of healed  Vascular: Palpable pedal pulses bilaterally. No edema or erythema noted. Capillary refill within normal limits.  Neurological: Epicritic and protective threshold grossly intact bilaterally.   Musculoskeletal Exam: Pain on palpation along the peroneal tendons as a crosses the ankle joint just posterior to the lateral malleolus.  Radiographic Exam:  Patient declined x-rays today  Assessment: 1.  Peroneal tendinitis right 2.  History of right foot surgery.  Please see above   Plan of Care:  1. Patient evaluated. 2.  Patient has significant improvement.  We will continue conservative treatment for the moment 3.  Continue physical therapy at [facility].  She is getting good improvement with the physical therapy and very satisfied 4.  Orthotics pending.  She has an appointment tomorrow here in our office for fittings 5.  Patient may transition out of the cam boot now.  She says that she has not worn the cam boot for about 1 week 6.  Return to clinic about 4-6 weeks after she has been wearing her orthotics which will be in about 2-3 months      Felecia Shelling, DPM [facility]  Dr. Felecia Shelling, DPM    [address]  [place], [place] [address]                Office [phone]  Fax [phone]

## 2021-09-28 ENCOUNTER — Other Ambulatory Visit: Payer: Self-pay

## 2021-09-28 ENCOUNTER — Ambulatory Visit: Payer: 59

## 2021-09-28 DIAGNOSIS — M7671 Peroneal tendinitis, right leg: Secondary | ICD-10-CM

## 2021-09-28 DIAGNOSIS — M7672 Peroneal tendinitis, left leg: Secondary | ICD-10-CM | POA: Diagnosis not present

## 2021-09-28 NOTE — Progress Notes (Signed)
SITUATION Reason for Consult: Evaluation for Bilateral Custom Foot Orthoses Patient / Caregiver Report: Patient is ready for foot orthotics  OBJECTIVE DATA: Patient History / Diagnosis:    ICD-10-CM   1. Peroneal tendinitis, right  M76.71       Current or Previous Devices: Previous functional foot orthotics historical  Foot Examination: Skin presentation:   Intact Ulcers & Callousing:   None and no history Toe / Foot Deformities:  Pes planus despite surgical reconstruction Weight Bearing Presentation:  planus Sensation:    Intact  ORTHOTIC RECOMMENDATION Recommended Device: 1x pair of custom functional foot orthotics  Shoe size 27M  GOALS OF ORTHOSES - Reduce Pain - Prevent Foot Deformity - Prevent Progression of Further Foot Deformity - Relieve Pressure - Improve the Overall Biomechanical Function of the Foot and Lower Extremity.  ACTIONS PERFORMED Patient was casted for Foot Orthoses via crush box. Procedure was explained and patient tolerated procedure well. All questions were answered and concerns addressed.  PLAN Potential out of pocket cost was communicated to patient. Casts are to be sent to [employer] for fabrication. Patient is to be called for fitting when devices are ready.

## 2021-10-26 ENCOUNTER — Other Ambulatory Visit: Payer: Self-pay

## 2021-10-26 ENCOUNTER — Ambulatory Visit: Payer: 59

## 2021-10-26 DIAGNOSIS — M7671 Peroneal tendinitis, right leg: Secondary | ICD-10-CM

## 2021-10-26 NOTE — Progress Notes (Signed)
SITUATION: Reason for Visit: Fitting and Delivery of Custom Fabricated Foot Orthoses Patient Report: Patient reports comfort and is satisfied with device.  OBJECTIVE DATA: Patient History / Diagnosis:     ICD-10-CM   1. Peroneal tendinitis, right  M76.71       Provided Device:  Custom functional foot orthotics  GOAL OF ORTHOSIS - Improve gait - Decrease energy expenditure - Improve Balance - Provide Triplanar stability of foot complex - Facilitate motion  ACTIONS PERFORMED Patient was fit with foot orthotics trimmed to shoe last. Patient tolerated fittign procedure. Device was modified as follows to better fit patient: - Toe plate was trimmed to shoe last  Patient was provided with verbal and written instruction and demonstration regarding donning, doffing, wear, care, proper fit, function, purpose, cleaning, and use of the orthosis and in all related precautions and risks and benefits regarding the orthosis.  Patient was also provided with verbal instruction regarding how to report any failures or malfunctions of the orthosis and necessary follow up care. Patient was also instructed to contact our office regarding any change in status that may affect the function of the orthosis.  Patient demonstrated independence with proper donning, doffing, and fit and verbalized understanding of all instructions.  PLAN: Patient is to follow up in one week or as necessary (PRN). All questions were answered and concerns addressed. Plan of care was discussed with and agreed upon by the patient.

## 2021-12-11 ENCOUNTER — Encounter: Payer: Self-pay | Admitting: Podiatry

## 2021-12-11 ENCOUNTER — Ambulatory Visit: Payer: 59 | Admitting: Podiatry

## 2021-12-11 ENCOUNTER — Other Ambulatory Visit: Payer: Self-pay

## 2021-12-11 DIAGNOSIS — M7671 Peroneal tendinitis, right leg: Secondary | ICD-10-CM

## 2021-12-11 DIAGNOSIS — M7751 Other enthesopathy of right foot: Secondary | ICD-10-CM | POA: Diagnosis not present

## 2021-12-11 MED ORDER — BETAMETHASONE SOD PHOS & ACET 6 (3-3) MG/ML IJ SUSP: 3.0000 mg | Freq: Once | INTRAMUSCULAR | Status: DC

## 2021-12-11 NOTE — Progress Notes (Signed)
? ?HPI: 66 y.o. female presenting today for follow-up evaluation of pain and tenderness to the right lower extremity.  Patient states that she continues to improve.  She has been wearing the orthotics daily.  She does have some residual pain to the anterior lateral aspect of the ankle.  No new complaints at this time ? ? ?Patient has a history of multiple surgeries.  Course of events: ?-1st surgery April 2021: [facility]. Dr. Excell Seltzer.  Bunion.  Hammertoes 2-5.  Arryana Tolleson calcaneal osteotomy.  Patient states that she never improved from the original surgery.  She continued to have pain and tenderness to the lateral aspect of the foot. ?-MRI WO CONTRAST RT ANKLE 08/08/2020: IMPRESSION: ?1. Surgical changes involving the calcaneus with a ?vertical/longitudinal osteotomy through the middle facet region of ?the calcaneus. The osteotomy site is ununited and displaced ?dorsally. Moderate surrounding marrow edema. ?2. Significant sinus tarsi narrowing by the dorsal displacement of ?the distal calcaneal osteotomy fragment. ?3. Focal significant tendinopathy involving the peroneus brevis ?tendon with suspected extensive interstitial tear along the lateral ?margin of the distal calcaneus. No complete tear/rupture. ?4. Intact medial and lateral ankle ligaments and medial, lateral and ?anterior ankle tendons. ?-2nd surgery January 2022: [facility].  Dr. Ether Griffins.  Derak Schurman calcaneal osteotomy revision.  Anticipated peroneal tendon repair however no tendon was repaired intraoperatively ? ?Patient states that she went to get a second opinion from another surgeon and the surgeon had recommended ankle fusion.  She presents for a second opinion regarding the anticipated surgery and would like a thorough evaluation ?  ? ?Past Medical History:  ?Diagnosis Date  ? 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  ? Arthritis   ? knees  ? PONV (postoperative nausea and vomiting)   ? after D&C and  Cholecystectomy  ? ?  ?Physical Exam: ?General: The patient is alert and oriented x3 in no acute distress. ? ?Dermatology: Skin is warm, dry and supple bilateral lower extremities. Negative for open lesions or macerations.  Skin incisions of healed ? ?Vascular: Palpable pedal pulses bilaterally. No edema or erythema noted. Capillary refill within normal limits. ? ?Neurological: Epicritic and protective threshold grossly intact bilaterally.  ? ?Musculoskeletal Exam: Minimal pain on palpation along the peroneal tendons as a crosses the ankle joint just posterior to the lateral malleolus.  There is also some pain on palpation to the anterolateral aspect of the right ankle joint today ? ?Assessment: ?1.  Peroneal tendinitis right ?2.  History of right foot surgery.  Please see above ?3.  Capsulitis lateral aspect of the right ankle ? ? ?Plan of Care:  ?1. Patient evaluated. ?2.  Continue physical therapy.  Patient continues to get improvement and she enjoys going to [facility] ?3.  Continue wearing custom orthotics.  She says that helped significantly and reduce her pain ?4.  Injection of 0.5 cc Celestone Soluspan injected in the lateral aspect of the right ankle joint ?5.  Return to clinic as needed ?  ?  ?Felecia Shelling, DPM ?[facility] ? ?Dr. Felecia Shelling, DPM  ?  ?[address]                                        ?[place], [place] [address]                ?  Office [phone]  ?Fax [phone] ? ? ? ? ?

## 2022-02-12 ENCOUNTER — Encounter: Payer: Self-pay | Admitting: Podiatry

## 2022-02-12 ENCOUNTER — Ambulatory Visit: Payer: 59 | Admitting: Podiatry

## 2022-02-12 DIAGNOSIS — G5791 Unspecified mononeuropathy of right lower limb: Secondary | ICD-10-CM

## 2022-02-12 DIAGNOSIS — M7671 Peroneal tendinitis, right leg: Secondary | ICD-10-CM

## 2022-02-12 DIAGNOSIS — M7751 Other enthesopathy of right foot: Secondary | ICD-10-CM

## 2022-02-12 MED ORDER — BETAMETHASONE SOD PHOS & ACET 6 (3-3) MG/ML IJ SUSP
3.0000 mg | Freq: Once | INTRAMUSCULAR | Status: AC
  Administered 2022-02-12: 3 mg via INTRA_ARTICULAR

## 2022-02-12 MED ORDER — GABAPENTIN 100 MG PO CAPS: 100.0000 mg | ORAL_CAPSULE | Freq: Three times a day (TID) | ORAL | 3 refills | Status: DC

## 2022-02-12 MED ORDER — MELOXICAM 15 MG PO TABS: 15.0000 mg | ORAL_TABLET | Freq: Every day | ORAL | 1 refills | Status: DC

## 2022-02-12 NOTE — Progress Notes (Signed)
? ?HPI: 66 y.o. female presenting today for follow-up evaluation of pain and tenderness to the right lower extremity.  Patient states that with physical therapy she began to perform lateral strengthening exercises which may have elicited some increased pain and tenderness to the outside of the ankle.  She says it is very sensitive now she would like to have it evaluated. ? ? ?Patient has a history of multiple surgeries.  Course of events: ?-1st surgery April 2021: [facility]. Dr. Excell Seltzer.  Bunion.  Hammertoes 2-5.  Lasheba Stevens calcaneal osteotomy.  Patient states that she never improved from the original surgery.  She continued to have pain and tenderness to the lateral aspect of the foot. ?-MRI WO CONTRAST RT ANKLE 08/08/2020: IMPRESSION: ?1. Surgical changes involving the calcaneus with a ?vertical/longitudinal osteotomy through the middle facet region of ?the calcaneus. The osteotomy site is ununited and displaced ?dorsally. Moderate surrounding marrow edema. ?2. Significant sinus tarsi narrowing by the dorsal displacement of ?the distal calcaneal osteotomy fragment. ?3. Focal significant tendinopathy involving the peroneus brevis ?tendon with suspected extensive interstitial tear along the lateral ?margin of the distal calcaneus. No complete tear/rupture. ?4. Intact medial and lateral ankle ligaments and medial, lateral and ?anterior ankle tendons. ?-2nd surgery January 2022: [facility].  Dr. Ether Griffins.  Raenell Mensing calcaneal osteotomy revision.  Anticipated peroneal tendon repair however no tendon was repaired intraoperatively ? ?Patient states that she went to get a second opinion from another surgeon and the surgeon had recommended ankle fusion.  She presents for a second opinion regarding the anticipated surgery and would like a thorough evaluation ?  ? ?Past Medical History:  ?Diagnosis Date  ? 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  ? Arthritis   ? knees  ? PONV  (postoperative nausea and vomiting)   ? after D&C and Cholecystectomy  ? ?  ?Physical Exam: ?General: The patient is alert and oriented x3 in no acute distress. ? ?Dermatology: Skin is warm, dry and supple bilateral lower extremities. Negative for open lesions or macerations.  Skin incisions of healed ? ?Vascular: Palpable pedal pulses bilaterally. No edema or erythema noted. Capillary refill within normal limits. ? ?Neurological: Epicritic and protective threshold grossly intact bilaterally.  Pain and tenderness with sharp shooting sensation down to the tips of her toes noted with light stroking of the lateral aspect of her ankle.  Positive Tinel's sign. ? ?Musculoskeletal Exam:  Today there is tenderness along the peroneal tendon right just posterior to the lateral fibular malleolus ? ?Assessment: ?1.  Peroneal tendinitis right ?2.  History of right foot surgery.  Please see above ?3.  Capsulitis lateral aspect of the right ankle ?4.  Neuritis right lower extremity ? ? ?Plan of Care:  ?1. Patient evaluated. ?2.  Patient feels as if she has hit a plateau with her physical therapy.  She may continue as needed as long as she continues to feel that she is improving.  Continue as needed [facility] ?3.  Continue wearing custom orthotics.  She says that helped significantly and reduce her pain ?4.  Injection of 0.5 cc Celestone Soluspan injected along the peroneal tendon just posterior to the fibular malleolus right ?5.  Prescription for gabapentin 100 mg 3 times daily  ?6.  Prescription for meloxicam 15 mg daily  ?7.  Return to clinic as needed ?  ?  ?Felecia Shelling, DPM ?[facility] ? ?Dr. Felecia Shelling,  DPM  ?  ?[address]                                        ?[place], [place] [address]                ?Office [phone]  ?Fax [phone] ? ? ? ? ?

## 2022-02-27 ENCOUNTER — Emergency Department
Admission: EM | Admit: 2022-02-27 | Discharge: 2022-02-27 | Disposition: A | Discharge status: Home / Self Care | Payer: 59 | Attending: Emergency Medicine | Admitting: Emergency Medicine

## 2022-02-27 ENCOUNTER — Other Ambulatory Visit: Payer: Self-pay

## 2022-02-27 ENCOUNTER — Emergency Department: Payer: 59

## 2022-02-27 DIAGNOSIS — M545 Low back pain, unspecified: Secondary | ICD-10-CM | POA: Diagnosis present

## 2022-02-27 DIAGNOSIS — M5416 Radiculopathy, lumbar region: Secondary | ICD-10-CM

## 2022-02-27 LAB — CBC
HCT: 48.1 % — ABNORMAL HIGH (ref 36.0–46.0)
Hemoglobin: 15.6 g/dL — ABNORMAL HIGH (ref 12.0–15.0)
MCH: 28.8 pg (ref 26.0–34.0)
MCHC: 32.4 g/dL (ref 30.0–36.0)
MCV: 88.9 fL (ref 80.0–100.0)
Platelets: 223 10*3/uL (ref 150–400)
RBC: 5.41 MIL/uL — ABNORMAL HIGH (ref 3.87–5.11)
RDW: 13.2 % (ref 11.5–15.5)
WBC: 7.9 10*3/uL (ref 4.0–10.5)
nRBC: 0 % (ref 0.0–0.2)

## 2022-02-27 LAB — BASIC METABOLIC PANEL
Anion gap: 6 (ref 5–15)
BUN: 15 mg/dL (ref 8–23)
CO2: 27 mmol/L (ref 22–32)
Calcium: 9.1 mg/dL (ref 8.9–10.3)
Chloride: 105 mmol/L (ref 98–111)
Creatinine, Ser: 0.94 mg/dL (ref 0.44–1.00)
GFR, Estimated: >60 mL/min (ref >60)
Glucose, Bld: 120 mg/dL — ABNORMAL HIGH (ref 70–99)
Potassium: 4.1 mmol/L (ref 3.5–5.1)
Sodium: 138 mmol/L (ref 135–145)

## 2022-02-27 LAB — TROPONIN I (HIGH SENSITIVITY): Troponin I (High Sensitivity): 3 ng/L (ref <18)

## 2022-02-27 MED ORDER — PREDNISONE 10 MG (21) PO TBPK: ORAL_TABLET | ORAL | 0 refills | Status: DC

## 2022-02-27 MED ORDER — HYDROCODONE-ACETAMINOPHEN 5-325 MG PO TABS
1.0000 | ORAL_TABLET | Freq: Once | ORAL | Status: AC
  Administered 2022-02-27: 1 via ORAL
  Filled 2022-02-27: qty 1

## 2022-02-27 NOTE — ED Triage Notes (Signed)
Pt states she has had lower back pain for 1 week- pt HR noted to be 125, pt denies CP and [facility]

## 2022-02-27 NOTE — ED Provider Notes (Signed)
[facility] Provider Note   Event Date/Time   First MD Initiated Contact with Patient 02/27/22 1538     (approximate) History  Back Pain  HPI Tammy Vang is a 66 y.o. female with a stated past medical history of calcaneal fracture and peroneal tendon tear on the right who presents for right-sided lumbar back, hip, and lateral thigh pain that began approximately 1 week prior to arrival while she was weeding her garden sitting on a stool.  Patient states that she sat for extended period of time she tried to get back up she noticed a dull ache in this region that has only gotten worse over the last 1 week.  Patient denies any numbness/paresthesias in any extremity.  Patient denies any fevers.  Patient denies any history of IV drug use.  Patient denies any history of cancer.  Patient states that she has had similar pain in the past when she "threw her back out" but states that this pain is worse.  Patient states that she is nonambulatory and has been moving around in a wheelchair, that she has had from her previous foot surgery, since this injury.  Triage note reports patient to be tachycardic with heart rate at 125.  Patient denies any palpitations, chest pain, shortness of breath Physical Exam  Triage Vital Signs: ED Triage Vitals  Enc Vitals Group     BP 02/27/22 1459 (!) 143/87     Pulse Rate 02/27/22 1459 (!) 125     Resp 02/27/22 1459 20     Temp 02/27/22 1459 97.6 F (36.4 C)     Temp Source 02/27/22 1459 Oral     SpO2 02/27/22 1459 94 %     Weight 02/27/22 1501 234 lb (106.1 kg)     Height 02/27/22 1501 5\' 8"  (1.727 m)     Head Circumference --      Peak Flow --      Pain Score 02/27/22 1501 10     Pain Loc --      Pain Edu? --      Excl. in GC? --    Most recent vital signs: Vitals:   02/27/22 1459 02/27/22 1855  BP: (!) 143/87 (!) 152/80  Pulse: (!) 125 100  Resp: 20 16  Temp: 97.6 F (36.4 C) 98 F (36.7 C)  SpO2: 94% 98%   General: Awake,  oriented x4. CV:  Good peripheral perfusion.  Resp:  Normal effort.  Abd:  No distention.  Other:  Middle-aged Caucasian female sitting in wheelchair in no distress.  Difficulty when attempting to stand from wheelchair however is able to stand up with support of the bed and moved back into the wheelchair with significant pain. ED Results / Procedures / Treatments  Labs (all labs ordered are listed, but only abnormal results are displayed) Labs Reviewed  BASIC METABOLIC PANEL - Abnormal; Notable for the following components:      Result Value   Glucose, Bld 120 (*)    All other components within normal limits  CBC - Abnormal; Notable for the following components:   RBC 5.41 (*)    Hemoglobin 15.6 (*)    HCT 48.1 (*)    All other components within normal limits  TROPONIN I (HIGH SENSITIVITY)  TROPONIN I (HIGH SENSITIVITY)   RADIOLOGY ED MD interpretation: 2 view chest x-ray interpreted by me shows no evidence of acute abnormalities including no pneumonia, pneumothorax, or widened mediastinum -Agree with radiology assessment Official radiology report(s):  DG Chest 2 View  Result Date: 02/27/2022 CLINICAL DATA:  Tachycardia. EXAM: CHEST - 2 VIEW COMPARISON:  None Available. FINDINGS: The cardiomediastinal silhouette is unremarkable. Mild elevation of the RIGHT hemidiaphragm is noted, likely chronic. There is no evidence of focal airspace disease, pulmonary edema, suspicious pulmonary nodule/mass, pleural effusion, or pneumothorax. No acute bony abnormalities are identified. IMPRESSION: No active cardiopulmonary disease. Electronically Signed   By: Harmon Pier M.D.   On: 02/27/2022 15:56   CT Lumbar Spine Wo Contrast  Result Date: 02/27/2022 CLINICAL DATA:  Low back pain, increased fracture risk EXAM: CT LUMBAR SPINE WITHOUT CONTRAST TECHNIQUE: Multidetector CT imaging of the lumbar spine was performed without intravenous contrast administration. Multiplanar CT image reconstructions were  also generated. RADIATION DOSE REDUCTION: This exam was performed according to the departmental dose-optimization program which includes automated exposure control, adjustment of the mA and/or kV according to patient size and/or use of iterative reconstruction technique. COMPARISON:  None Available. FINDINGS: Segmentation: 5 lumbar type vertebrae. Alignment: Mild levocurvature.  No anteroposterior listhesis. Vertebrae: Vertebral body heights are maintained. No acute fracture. No destructive osseous lesion. Paraspinal and other soft tissues: Unremarkable. Disc levels: L1-L2:  Mild facet hypertrophy.  No stenosis. L2-L3: Disc bulge with endplate osteophytic ridging. Facet hypertrophy with ligamentum flavum thickening. At least moderate canal stenosis. Narrowing of the subarticular recesses. Mild bilateral foraminal stenosis with facet spurring abutting the exiting nerve roots. L3-L4: Disc bulge. Facet hypertrophy with ligamentum flavum thickening. Moderate canal stenosis. Narrowing of the subarticular recesses. Mild foraminal stenosis. L4-L5: Disc bulge. Facet hypertrophy with ligamentum flavum thickening. Moderate canal stenosis. Narrowing of the subarticular recesses. Mild foraminal stenosis. L5-S1: Disc bulge. Facet hypertrophy. No canal or foraminal stenosis. IMPRESSION: No acute abnormality. Multilevel degenerative changes. There is facet arthropathy throughout. At least moderate canal stenosis several levels. Outpatient MR imaging could be considered for better evaluation. Electronically Signed   By: Guadlupe Spanish M.D.   On: 02/27/2022 17:48   PROCEDURES: Critical Care performed: No Procedures MEDICATIONS ORDERED IN ED: Medications  HYDROcodone-acetaminophen (NORCO/VICODIN) 5-325 MG per tablet 1 tablet (1 tablet Oral Given 02/27/22 1701)   IMPRESSION / MDM / ASSESSMENT AND PLAN / ED COURSE  I reviewed the triage vital signs and the nursing notes.                              Patient presents for low  back pain. Given History and Exam the patient appears to be at low risk for Spinal Cord Compression Syndrome, Vertebral Malignancy/Mets, acute Spinal Fracture, Vertebral Osteomyelitis, Epidural Abscess, Infected or Obstructing Kidney Stone.  Their presentation appears most likely to be secondary to non-emergent musculoskeletal etiology vs non-emergent disc herniation.  ED Workup: Defer imaging and labwork for outpatient follow up at this time.  Disposition: Discharge. Strict return precautions discussed with patient with full understanding. Advised patient to follow up promptly with primary care provider   FINAL CLINICAL IMPRESSION(S) / ED DIAGNOSES   Final diagnoses:  Lumbar back pain with radiculopathy affecting right lower extremity   Rx / DC Orders   ED Discharge Orders          Ordered    predniSONE (STERAPRED UNI-PAK 21 TAB) 10 MG (21) TBPK tablet        02/27/22 1817           Note:  This document was prepared using Dragon voice recognition software and may include unintentional dictation errors.   Vicente Males,  Clent Jacks, MD 02/27/22 Windell Moment

## 2022-03-19 ENCOUNTER — Ambulatory Visit: Payer: 59 | Admitting: Podiatry

## 2022-03-19 ENCOUNTER — Encounter: Payer: Self-pay | Admitting: Podiatry

## 2022-03-19 DIAGNOSIS — M7671 Peroneal tendinitis, right leg: Secondary | ICD-10-CM | POA: Diagnosis not present

## 2022-03-19 NOTE — Progress Notes (Signed)
HPI: 66 y.o. female presenting today for follow-up evaluation of pain and tenderness to the right lower extremity.  Patient currently no longer going to physical therapy.  She says that the injection she received last visit only helped for about a week.   Patient has a history of multiple surgeries.  Course of events: -1st surgery April 2021: [facility]. Dr. Luana Shu.  Bunion.  Hammertoes 2-5.  Tynia Wiers calcaneal osteotomy.  Patient states that she never improved from the original surgery.  She continued to have pain and tenderness to the lateral aspect of the foot. -MRI WO CONTRAST RT ANKLE 08/08/2020: IMPRESSION: 1. Surgical changes involving the calcaneus with a vertical/longitudinal osteotomy through the middle facet region of the calcaneus. The osteotomy site is ununited and displaced dorsally. Moderate surrounding marrow edema. 2. Significant sinus tarsi narrowing by the dorsal displacement of the distal calcaneal osteotomy fragment. 3. Focal significant tendinopathy involving the peroneus brevis tendon with suspected extensive interstitial tear along the lateral margin of the distal calcaneus. No complete tear/rupture. 4. Intact medial and lateral ankle ligaments and medial, lateral and anterior ankle tendons. -2nd surgery January 2022: [facility].  Dr. Vickki Muff.  Arwa Yero calcaneal osteotomy revision.  Anticipated peroneal tendon repair however no tendon was repaired intraoperatively  Patient states that she went to get a second opinion from another surgeon and the surgeon had recommended ankle fusion.  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   Arthritis    knees   PONV (postoperative nausea and vomiting)    after D&C and Cholecystectomy     Physical Exam: General: The patient is  alert and oriented x3 in no acute distress.  Dermatology: Skin is warm, dry and supple bilateral lower extremities. Negative for open lesions or macerations.  Skin incisions of healed  Vascular: Palpable pedal pulses bilaterally. No edema or erythema noted. Capillary refill within normal limits.  Neurological: Epicritic and protective threshold grossly intact bilaterally.  Pain and tenderness with sharp shooting sensation down to the tips of her toes noted with light stroking of the lateral aspect of her ankle.  Positive Tinel's sign.  Musculoskeletal Exam: There continues to be tenderness along the peroneal tendon right just posterior to the lateral fibular malleolus  Assessment: 1.  Peroneal tendinitis right 2.  History of right foot surgery.  Please see above 3.  Capsulitis lateral aspect of the right ankle 4.  Neuritis/lumbar radiculopathy right lower extremity   Plan of Care:  1. Patient evaluated. Patient states that the last injection only helped for maximum up to 1 week.  She is back to the same original pain.  2.  Order placed for new MRI RT ankle wo contrast.  3.  Continue wearing custom orthotics.  She says that helped significantly and reduce her pain 4.  Patient has an appointment with neuro spine, Dr. Meade Maw for lumbar radiculopathy 5.  Patient states that she had no relief with the gabapentin or meloxicam.  She may discontinue  6.  Return to clinic as needed     Edrick Kins, DPM [facility]  Dr. Edrick Kins, DPM    [address]  [place], [place] [address]                Office [phone]  Fax [phone]

## 2022-03-29 ENCOUNTER — Other Ambulatory Visit: Payer: Self-pay | Admitting: Neurosurgery

## 2022-03-29 DIAGNOSIS — M48062 Spinal stenosis, lumbar region with neurogenic claudication: Secondary | ICD-10-CM

## 2022-03-29 DIAGNOSIS — M5416 Radiculopathy, lumbar region: Secondary | ICD-10-CM

## 2022-04-14 ENCOUNTER — Other Ambulatory Visit: Payer: 59

## 2022-04-14 ENCOUNTER — Inpatient Hospital Stay: Admission: RE | Admit: 2022-04-14 | Payer: 59 | Source: Ambulatory Visit

## 2022-04-16 ENCOUNTER — Ambulatory Visit: Payer: 59 | Admitting: Neurosurgery

## 2022-04-23 ENCOUNTER — Ambulatory Visit
Admission: RE | Admit: 2022-04-23 | Discharge: 2022-04-23 | Disposition: A | Discharge status: Home / Self Care | Payer: Medicare Other | Source: Ambulatory Visit | Attending: Neurosurgery | Admitting: Neurosurgery

## 2022-04-23 ENCOUNTER — Ambulatory Visit
Admission: RE | Admit: 2022-04-23 | Discharge: 2022-04-23 | Disposition: A | Discharge status: Home / Self Care | Payer: 59 | Source: Ambulatory Visit | Attending: Podiatry | Admitting: Podiatry

## 2022-04-23 DIAGNOSIS — M5416 Radiculopathy, lumbar region: Secondary | ICD-10-CM

## 2022-04-23 DIAGNOSIS — M48062 Spinal stenosis, lumbar region with neurogenic claudication: Secondary | ICD-10-CM

## 2022-04-23 DIAGNOSIS — M7671 Peroneal tendinitis, right leg: Secondary | ICD-10-CM

## 2022-04-29 ENCOUNTER — Ambulatory Visit (INDEPENDENT_AMBULATORY_CARE_PROVIDER_SITE_OTHER): Payer: Medicare Other | Admitting: Neurosurgery

## 2022-04-29 ENCOUNTER — Encounter: Payer: Self-pay | Admitting: Neurosurgery

## 2022-04-29 VITALS — BP 128/80 | Ht 68.0 in | Wt 236.8 lb

## 2022-04-29 DIAGNOSIS — M5416 Radiculopathy, lumbar region: Secondary | ICD-10-CM

## 2022-04-29 DIAGNOSIS — M48061 Spinal stenosis, lumbar region without neurogenic claudication: Secondary | ICD-10-CM | POA: Diagnosis not present

## 2022-04-29 NOTE — Progress Notes (Signed)
Follow-up note: Referring Physician:  No referring provider defined for this encounter.  Primary Physician:  Pcp, No  Chief Complaint:  f/u after MRI   History of Present Illness: Tammy Vang is a 66 y.o. female who presents today for review of lumbar MRI.  She states that she continues to have intermittent right anterior thigh pain that radiates into her right lower leg and right-sided back pain.  She also has developed right tailbone pain since her MRI last week.  She denies any additional or worsening symptoms. She is currently using crutches due to her right ankle pathology and is to follow-up with orthopedics next week.   LOV 03/25/22 Ms. Tammy Vang is a 66 y.o with a history of Obesity and multiple right foot surgeries who is here today with a chief complaint of right sided low back pain and right anterior thigh pain. She states this started about 2 years ago after her last foot surgery. She has had intermittent flares that have been resistant to TENS therapy, Norco, heat, prednisone, gabapentin, meloxicam, and Flexeril. Her most recent flare started about a month ago without any particular inciting event although she states she has been more active in her yard. The pain seems to be worse with walking however there does not seem to be any significant relieving factors. She describes the pain as a dull aching in her back with radiation into her right anterior thigh that is a deep throbbing type pain. She denies any similar symptoms on the left. In addition to the symptoms she endorses hyperesthesias in her right shin that started after her most recent foot surgery and seem to be worse with light touch and the right anterior shin. She also endorses right lateral foot pain with ambulating. She denies any bowel or bladder dysfunction or lower extremity weakness.  Review of Systems:  A 10 point review of systems is negative,  and the pertinent positives and negatives detailed in the  HPI.  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   Arthritis    knees   PONV (postoperative nausea and vomiting)    after D&C and Cholecystectomy    Past Surgical History: Past Surgical History:  Procedure Laterality Date   BUNIONECTOMY Right    BUNIONECTOMY Right 01/22/2020   Procedure: BUNIONECTOMY LAPIDUS TYPE + AKIN RIGHT;  Surgeon: Rosetta Posner, DPM;  Location: [facility];  Service: Podiatry;  Laterality: Right;  pre op anes. pop/saph block   CHOLECYSTECTOMY     COLONOSCOPY     DILATION AND CURETTAGE OF UTERUS     FLAT FOOT CORRECTION Right 01/22/2020   Procedure: EVANS/MCDO;  Surgeon: Rosetta Posner, DPM;  Location: [facility];  Service: Podiatry;  Laterality: Right;   HAMMER TOE SURGERY Right 01/22/2020   Procedure: HAMMER TOE CORRECTION;  Surgeon: Rosetta Posner, DPM;  Location: [facility];  Service: Podiatry;  Laterality: Right;   TENDON REPAIR Right 10/24/2020   Procedure: FLEXOR TENDON REPAIR SECONDARY RIGHT; NONUNION REPAIR SECONDARDY RIGHT;  Surgeon: [name], DPM;  Location: [facility];  Service: Podiatry;  Laterality: Right;    Allergies: Allergies as of 04/29/2022 - Review Complete 04/29/2022  Allergen Reaction Noted   Gluten meal Diarrhea 01/09/2020   Lactose  10/14/2020   Lactose intolerance (gi) Other (See Comments) 10/06/2020    Medications: Outpatient Encounter Medications as of 04/29/2022  Medication Sig  Biotin 1000 MCG tablet Take 1,000 mcg by mouth daily.   Boswellia-Glucosamine-Vit D (OSTEO BI-FLEX ONE PER DAY PO) Take 1 tablet by mouth daily at 12 noon.   Calcium Carbonate (CALCARB 600 PO) Take 600 mg by mouth daily.   cholecalciferol (VITAMIN D) 25 MCG (1000 UNIT) tablet Take 6,000 Units by mouth daily.   Omega-3 1000 MG CAPS Take 1,000 mg by mouth daily.   sodium chloride (OCEAN) 0.65 % SOLN nasal spray Place 1  spray into both nostrils 2 (two) times daily.   [DISCONTINUED] gabapentin (NEURONTIN) 100 MG capsule Take 1 capsule (100 mg total) by mouth 3 (three) times daily. (Patient not taking: Reported on 04/29/2022)   [DISCONTINUED] meloxicam (MOBIC) 15 MG tablet Take 1 tablet (15 mg total) by mouth daily. (Patient not taking: Reported on 04/29/2022)   [DISCONTINUED] oxyCODONE-acetaminophen (PERCOCET) 5-325 MG tablet Take 1-2 tablets by mouth every 6 (six) hours as needed for severe pain. Max 6 tabs per day (Patient not taking: Reported on 04/29/2022)   [DISCONTINUED] predniSONE (STERAPRED UNI-PAK 21 TAB) 10 MG (21) TBPK tablet As directed on packaging (Patient not taking: Reported on 04/29/2022)   Facility-Administered Encounter Medications as of 04/29/2022  Medication   betamethasone acetate-betamethasone sodium phosphate (CELESTONE) injection 3 mg    Social History: Social History   Tobacco Use   Smoking status: Never   Smokeless tobacco: Never  Vaping Use   Vaping Use: Never used  Substance Use Topics   Alcohol use: Not Currently   Drug use: Never    Family Medical History: No family history on file.  Exam: Vitals:   04/29/22 0921  BP: 128/80     General: A&O x3 in no acute distress.  ROM of spine: WNL.  Palpation of spine: nontender.  Strength in the left lower extremity is EHL 5/5, Dorsiflexion 5/5, Plantar flexion 5/5, Hamstring 5/5, Quadricep 5/5, Iliopsoas 5/5. Strength in the right lower extremity is EHL -, Dorsiflexion -, Plantar flexion - Hamstring 5/5, Quadricep 5/5, Iliopsoas 5/5. Reflexes are 2+ and symmetric at the patella.   Bilateral lower extremity sensation is intact to light touch.  Clonus is negative.  Toes down-going.  Ambulating with the assistance of crutches due to ankle.  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   Prominent dorsal epidural fat and a shallow disc bulge cause mild compression of the thecal sac at L3-4. There is also mild bilateral foraminal narrowing at L3-4.   Mild to moderate central canal stenosis L4-5 with also mild bilateral foraminal narrowing.     Electronically Signed   By: Drusilla Kanner M.D.   On: 04/25/2022 08:25  I have personally reviewed the images and agree with the above interpretation.  Assessment and Plan: Ms. Ohlinger is a pleasant 66 y.o. female with acute worsening of right-sided low back and right anterior thigh pain.  Her MRI does show multilevel degenerative changes of the lumbar spine most specifically L2-3 with a right-sided synovial cyst that is likely causing her anterior thigh pain.  She is to start physical therapy next week.  Reviewed her MRI in detail and she would like to move forward with injections.  I have placed a referral to Dr. Yves Dill for this. I will see her back on 07/20/22 at 10am via telephone visit to review her progress and further plan of care.  She was encouraged  to call the office in the interim with any questions or concerns.  I spent a total of 49 minutes in both face-to-face and non-face-to-face activities for this visit on the date of this encounter including MRI chart review, MRI review both dependently and with patient, review of risks and benefits of conservative treatment options, a brief discussion of possible surgical options, charting and placing orders.  Manning Charity PA-C  Neurosurgery

## 2022-05-04 ENCOUNTER — Ambulatory Visit (INDEPENDENT_AMBULATORY_CARE_PROVIDER_SITE_OTHER): Payer: Medicare Other | Admitting: Podiatry

## 2022-05-04 DIAGNOSIS — M7751 Other enthesopathy of right foot: Secondary | ICD-10-CM | POA: Diagnosis not present

## 2022-05-04 DIAGNOSIS — M25571 Pain in right ankle and joints of right foot: Secondary | ICD-10-CM

## 2022-05-04 DIAGNOSIS — G5791 Unspecified mononeuropathy of right lower limb: Secondary | ICD-10-CM

## 2022-05-04 MED ORDER — BETAMETHASONE SOD PHOS & ACET 6 (3-3) MG/ML IJ SUSP
3.0000 mg | Freq: Once | INTRAMUSCULAR | Status: AC
  Administered 2022-05-04: 3 mg via INTRA_ARTICULAR

## 2022-05-04 NOTE — Progress Notes (Signed)
HPI: 66 y.o. female presenting today for follow-up evaluation of pain and tenderness to the right lower extremity.  Patient currently no longer going to physical therapy.  She says that the injection she received last visit only helped for about a week.   Patient has a history of multiple surgeries.  Course of events: -1st surgery April 2021: [facility]. Dr. Excell Seltzer.  Bunion.  Hammertoes 2-5.  Quanda Pavlicek calcaneal osteotomy.  Patient states that she never improved from the original surgery.  She continued to have pain and tenderness to the lateral aspect of the foot. -MRI WO CONTRAST RT ANKLE 08/08/2020: IMPRESSION: 1. Surgical changes involving the calcaneus with a vertical/longitudinal osteotomy through the middle facet region of the calcaneus. The osteotomy site is ununited and displaced dorsally. Moderate surrounding marrow edema. 2. Significant sinus tarsi narrowing by the dorsal displacement of the distal calcaneal osteotomy fragment. 3. Focal significant tendinopathy involving the peroneus brevis tendon with suspected extensive interstitial tear along the lateral margin of the distal calcaneus. No complete tear/rupture. 4. Intact medial and lateral ankle ligaments and medial, lateral and anterior ankle tendons. -2nd surgery January 2022: [facility].  Dr. Ether Griffins.  Antonieta Slaven calcaneal osteotomy revision.  Anticipated peroneal tendon repair however no tendon was repaired intraoperatively  Patient states that she went to get a second opinion from another surgeon and the surgeon had recommended ankle fusion.  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   Arthritis    knees   PONV (postoperative nausea and vomiting)    after D&C and Cholecystectomy     Physical Exam: General: The patient is  alert and oriented x3 in no acute distress.  Dermatology: Skin is warm, dry and supple bilateral lower extremities. Negative for open lesions or macerations.  Skin incisions of healed  Vascular: Palpable pedal pulses bilaterally. No edema or erythema noted. Capillary refill within normal limits.  Neurological: Epicritic and protective threshold grossly intact bilaterally.  Pain and tenderness with sharp shooting sensation down to the tips of her toes noted with light stroking of the lateral aspect of her ankle.  Positive Tinel's sign.  Musculoskeletal Exam: There continues to be tenderness along the peroneal tendon right just posterior to the lateral fibular malleolus.  There is also pain with inversion and eversion of the subtalar joint and sinus tarsi movement consistent with findings on MRI of the sinus tarsi collapse.  MR ANKLE RT WO CONTRAST 04/23/2022 IMPRESSION: Compared to 08/08/2020:   1. Interval instrumented fixation of the prior displaced osteotomy of the anterior calcaneus. There is similar alignment. There now appears to be osseous fusion. 2. High-grade attenuation of the peroneus brevis tendon starting at the distal aspect of the fibula and involving the prior area of extensive longitudinal partial-thickness tear. Presumably there has been interval tendon debridement. 3. There is again collapse of the sinus tarsi secondary to the displaced vertical osteotomy within the anterior aspect of the calcaneus.    Assessment: 1.  Peroneal tendinitis right 2.  History of right foot surgery.  Please see above 3.  Capsulitis lateral aspect of the right ankle 4.  Neuritis/lumbar radiculopathy right lower extremity 5.  Sinus tarsitis right   Plan of Care:  1. Patient evaluated.  MRI reviewed 2.  Injection of 0.5 cc Celestone Soluspan  injection of the sinus tarsi right 3.  Continue wearing custom orthotics with good supportive shoes and sneakers  4.  Patient is beginning physical  therapy for her back.  Perhaps any alleviation of her back may alleviate some pressure down her right lower extremity 5.  Personally I do not feel that additional surgery would serve the patient's best interest.  She has already had multiple revisional surgeries and currently disadvantages of surgical intervention would outweigh any potential advantages 6.  Ankle brace dispensed.  Wear daily  7.  Return to clinic in 6 weeks     Felecia Shelling, DPM [facility]  Dr. Felecia Shelling, DPM    [address] [place], [place] [address]                Office [phone]  Fax [phone]

## 2022-05-19 ENCOUNTER — Emergency Department
Admission: EM | Admit: 2022-05-19 | Discharge: 2022-05-19 | Disposition: A | Discharge status: Home / Self Care | Payer: Medicare Other | Attending: Emergency Medicine | Admitting: Emergency Medicine

## 2022-05-19 ENCOUNTER — Encounter: Payer: Self-pay | Admitting: Podiatry

## 2022-05-19 ENCOUNTER — Other Ambulatory Visit: Payer: Self-pay

## 2022-05-19 ENCOUNTER — Emergency Department: Payer: Medicare Other

## 2022-05-19 ENCOUNTER — Encounter: Payer: Self-pay | Admitting: Emergency Medicine

## 2022-05-19 DIAGNOSIS — R519 Headache, unspecified: Secondary | ICD-10-CM | POA: Insufficient documentation

## 2022-05-19 DIAGNOSIS — R079 Chest pain, unspecified: Secondary | ICD-10-CM | POA: Insufficient documentation

## 2022-05-19 DIAGNOSIS — R6884 Jaw pain: Secondary | ICD-10-CM

## 2022-05-19 LAB — BASIC METABOLIC PANEL
Anion gap: 7 (ref 5–15)
BUN: 16 mg/dL (ref 8–23)
CO2: 24 mmol/L (ref 22–32)
Calcium: 9.2 mg/dL (ref 8.9–10.3)
Chloride: 110 mmol/L (ref 98–111)
Creatinine, Ser: 0.96 mg/dL (ref 0.44–1.00)
GFR, Estimated: >60 mL/min (ref >60)
Glucose, Bld: 98 mg/dL (ref 70–99)
Potassium: 3.5 mmol/L (ref 3.5–5.1)
Sodium: 141 mmol/L (ref 135–145)

## 2022-05-19 LAB — CBC
HCT: 45.5 % (ref 36.0–46.0)
Hemoglobin: 15 g/dL (ref 12.0–15.0)
MCH: 29.2 pg (ref 26.0–34.0)
MCHC: 33 g/dL (ref 30.0–36.0)
MCV: 88.5 fL (ref 80.0–100.0)
Platelets: 206 10*3/uL (ref 150–400)
RBC: 5.14 MIL/uL — ABNORMAL HIGH (ref 3.87–5.11)
RDW: 13.3 % (ref 11.5–15.5)
WBC: 8.3 10*3/uL (ref 4.0–10.5)
nRBC: 0 % (ref 0.0–0.2)

## 2022-05-19 LAB — TROPONIN I (HIGH SENSITIVITY)
Troponin I (High Sensitivity): 4 ng/L (ref <18)
Troponin I (High Sensitivity): 7 ng/L (ref <18)

## 2022-05-19 MED ORDER — DIPHENHYDRAMINE HCL 50 MG/ML IJ SOLN
25.0000 mg | Freq: Once | INTRAMUSCULAR | Status: AC
Start: 2022-05-19 — End: 2022-05-19
  Administered 2022-05-19: 25 mg via INTRAVENOUS
  Filled 2022-05-19: qty 1

## 2022-05-19 MED ORDER — PROCHLORPERAZINE MALEATE 10 MG PO TABS: 10.0000 mg | ORAL_TABLET | Freq: Four times a day (QID) | ORAL | 0 refills | Status: DC | PRN

## 2022-05-19 MED ORDER — PROCHLORPERAZINE EDISYLATE 10 MG/2ML IJ SOLN
10.0000 mg | Freq: Once | INTRAMUSCULAR | Status: AC
Start: 2022-05-19 — End: 2022-05-19
  Administered 2022-05-19: 10 mg via INTRAVENOUS
  Filled 2022-05-19: qty 2

## 2022-05-19 MED ORDER — SODIUM CHLORIDE 0.9 % IV BOLUS
1000.0000 mL | Freq: Once | INTRAVENOUS | Status: AC
  Administered 2022-05-19: 1000 mL via INTRAVENOUS

## 2022-05-19 NOTE — ED Triage Notes (Signed)
First Nurse: Pt here via [facility] with sudden CP radiating to her left arm up to her jaw. Pain has resolved on ems arrival. Pt took 324 of aspirin. 20G RAC, refused nitro.    110 132/74

## 2022-05-19 NOTE — ED Triage Notes (Signed)
Pt sts that at 1030 this AM pt started to have a headache and than it went to her jaw and left arm than going into her chest. Pt does not endorse any cardiac hx. Pt is A/Ox4, NAD, Skin P/W/D

## 2022-05-19 NOTE — ED Notes (Signed)
Pt requested that the Fluid be stopped. She doesn't want it.

## 2022-05-19 NOTE — ED Notes (Signed)
See triage note  Presents with via EMS with some chest pain   States pain did radiate in left jaw and arm  {ain free at present

## 2022-05-19 NOTE — ED Provider Notes (Signed)
[facility] Provider Note    Event Date/Time   First MD Initiated Contact with Patient 05/19/22 1417     (approximate)   History   Chief Complaint Chest Pain   HPI  Tammy Vang is a 66 y.o. female with no significant past medical history who presents to the ED complaining  of headache and chest pain. Patient reports that she was sitting in her wheelchair around 10:30 this morning, when she had sudden onset of severe bilateral throbbing headache.  She states that the pain began to radiate down both sides of her neck into her jaw.  It eventually moved into her chest and she also began having some palpitations.  She states that the palpitations have improved and she denies any current discomfort in her chest, however she continues to complain of headache.  She states that she is also feeling nauseous, denies any vision changes, speech changes, numbness, or weakness.  She has never had similar symptoms in the past, denies any history of migraines or cardiac history.  She has not taken anything for symptoms prior to arrival.      Physical Exam   Triage Vital Signs: ED Triage Vitals  Enc Vitals Group     BP 05/19/22 1212 99/71     Pulse Rate 05/19/22 1212 (!) 118     Resp 05/19/22 1212 16     Temp 05/19/22 1212 97.8 F (36.6 C)     Temp Source 05/19/22 1212 Oral     SpO2 05/19/22 1212 95 %     Weight 05/19/22 1212 234 lb (106.1 kg)     Height 05/19/22 1212 5\' 8"  (1.727 m)     Head Circumference --      Peak Flow --      Pain Score 05/19/22 1220 0     Pain Loc --      Pain Edu? --      Excl. in GC? --     Most recent vital signs: Vitals:   05/19/22 1648 05/19/22 1746  BP:  129/84  Pulse:  87  Resp:  18  Temp: 97.6 F (36.4 C) 98.3 F (36.8 C)  SpO2:  98%    Constitutional: Alert and oriented. Eyes: Conjunctivae are normal.  Pupils equal, round, and reactive to light bilaterally. Head: Atraumatic. Nose: No  congestion/rhinnorhea. Mouth/Throat: Mucous membranes are moist.  Bilateral jaw tenderness noted with range of motion intact.  No intraoral edema, erythema, or fluctuance. Neck: No midline or lateral cervical tenderness. Cardiovascular: Normal rate, regular rhythm. Grossly normal heart sounds.  2+ radial pulses bilaterally. Respiratory: Normal respiratory effort.  No retractions. Lungs CTAB.  No chest wall tenderness to palpation. Gastrointestinal: Soft and nontender. No distention. Musculoskeletal: No lower extremity tenderness nor edema.  Neurologic:  Normal speech and language. No gross focal neurologic deficits are appreciated.    ED Results / Procedures / Treatments   Labs (all labs ordered are listed, but only abnormal results are displayed) Labs Reviewed  CBC - Abnormal; Notable for the following components:      Result Value   RBC 5.14 (*)    All other components within normal limits  BASIC METABOLIC PANEL  TROPONIN I (HIGH SENSITIVITY)  TROPONIN I (HIGH SENSITIVITY)     EKG  ED ECG REPORT I, 07/19/22, the attending physician, personally viewed and interpreted this ECG.   Date: 05/19/2022  EKG Time: 12:09  Rate: 117  Rhythm: sinus tachycardia  Axis: Normal  Intervals:none  ST&T Change: None  RADIOLOGY Chest x-ray reviewed and interpreted by me with no infiltrate, edema, or effusion.  PROCEDURES:  Critical Care performed: No  Procedures   MEDICATIONS ORDERED IN ED: Medications  prochlorperazine (COMPAZINE) injection 10 mg (10 mg Intravenous Given 05/19/22 1503)  diphenhydrAMINE (BENADRYL) injection 25 mg (25 mg Intravenous Given 05/19/22 1503)  sodium chloride 0.9 % bolus 1,000 mL (0 mLs Intravenous Stopped 05/19/22 1649)     IMPRESSION / MDM / ASSESSMENT AND PLAN / ED COURSE  I reviewed the triage vital signs and the nursing notes.                              66 y.o. female with no significant past medical history presents to the ED with acute  onset headache rating down both sides of her jaw and into her chest starting around 1030 this morning.  Patient's presentation is most consistent with acute presentation with potential threat to life or bodily function.  Differential diagnosis includes, but is not limited to, SAH, migraine, meningitis, carotid dissection, ACS, PE, pneumonia, pneumothorax.  Patient well-appearing and in no acute distress, vital signs are unremarkable and she states that her chest pain and palpitations have resolved.  Her pain is reproducible with palpation of both sides of her jaw but no jaw or oral abnormalities noted on examination.  She has no focal neurologic deficits on exam, no meningismus noted.  Given acute onset severe headache, CT head was performed but negative for acute process, doubt SAH given imaging within 6 hours of onset.  EKG shows no evidence of arrhythmia or ischemia, initial troponin is negative.  Will observe on cardiac monitor and check repeat troponin.  Additional labs are reassuring with no significant anemia, leukocytosis, electrolyte abnormality, or AKI.  Plan to treat with migraine cocktail and reassess.  Patient reports feeling better following migraine cocktail, no ongoing chest pain at this time and repeat troponin within normal limits.  She is appropriate for discharge home with PCP follow-up, was counseled to return to the ED for new or worsening symptoms.  Patient agrees with plan.      FINAL CLINICAL IMPRESSION(S) / ED DIAGNOSES   Final diagnoses:  Acute nonintractable headache, unspecified headache type  Jaw pain  Chest pain, unspecified type     Rx / DC Orders   ED Discharge Orders          Ordered    prochlorperazine (COMPAZINE) 10 MG tablet  Every 6 hours PRN        05/19/22 1755             Note:  This document was prepared using Dragon voice recognition software and may include unintentional dictation errors.   Chesley Noon, MD 05/19/22 [phone]

## 2022-05-30 ENCOUNTER — Encounter: Payer: Self-pay | Admitting: Intensive Care

## 2022-05-30 ENCOUNTER — Emergency Department: Payer: Medicare Other

## 2022-05-30 ENCOUNTER — Other Ambulatory Visit: Payer: Self-pay

## 2022-05-30 ENCOUNTER — Emergency Department
Admission: EM | Admit: 2022-05-30 | Discharge: 2022-05-31 | Disposition: A | Discharge status: Home / Self Care | Payer: Medicare Other | Attending: Emergency Medicine | Admitting: Emergency Medicine

## 2022-05-30 DIAGNOSIS — R000 Tachycardia, unspecified: Secondary | ICD-10-CM | POA: Diagnosis present

## 2022-05-30 DIAGNOSIS — R002 Palpitations: Secondary | ICD-10-CM | POA: Diagnosis not present

## 2022-05-30 LAB — BASIC METABOLIC PANEL
Anion gap: 5 (ref 5–15)
BUN: 21 mg/dL (ref 8–23)
CO2: 25 mmol/L (ref 22–32)
Calcium: 9.4 mg/dL (ref 8.9–10.3)
Chloride: 110 mmol/L (ref 98–111)
Creatinine, Ser: 0.92 mg/dL (ref 0.44–1.00)
GFR, Estimated: >60 mL/min (ref >60)
Glucose, Bld: 115 mg/dL — ABNORMAL HIGH (ref 70–99)
Potassium: 3.8 mmol/L (ref 3.5–5.1)
Sodium: 140 mmol/L (ref 135–145)

## 2022-05-30 LAB — CBC
HCT: 48.5 % — ABNORMAL HIGH (ref 36.0–46.0)
Hemoglobin: 15.5 g/dL — ABNORMAL HIGH (ref 12.0–15.0)
MCH: 28.7 pg (ref 26.0–34.0)
MCHC: 32 g/dL (ref 30.0–36.0)
MCV: 89.6 fL (ref 80.0–100.0)
Platelets: 250 10*3/uL (ref 150–400)
RBC: 5.41 MIL/uL — ABNORMAL HIGH (ref 3.87–5.11)
RDW: 13.5 % (ref 11.5–15.5)
WBC: 8.5 10*3/uL (ref 4.0–10.5)
nRBC: 0 % (ref 0.0–0.2)

## 2022-05-30 LAB — TSH: TSH: 3.24 u[IU]/mL (ref 0.350–4.500)

## 2022-05-30 LAB — TROPONIN I (HIGH SENSITIVITY): Troponin I (High Sensitivity): 4 ng/L (ref <18)

## 2022-05-30 NOTE — ED Provider Triage Note (Signed)
  Emergency Medicine Provider Triage Evaluation Note  Tammy Vang , a 66 y.o.female,  was evaluated in triage.  Pt complains of palpitations/tachycardia.  Patient states that she has had 3 episodes of rapid heart rate that lasted for approximately an hour each.  The first episode woke her up from her sleep this morning.  Denies any chest pain or shortness of breath.  She states that it could be related to the Voltaren gel that she has been using for the past couple weeks.  Endorses some bilateral jaw pain as well.   Review of Systems  Positive: Tachycardia Negative: Denies fever, chest pain, vomiting  Physical Exam   Vitals:   05/30/22 1640  BP: (!) 127/92  Pulse: (!) 108  Resp: 16  Temp: 97.9 F (36.6 C)  SpO2: 97%   Gen:   Awake, no distress   Resp:  Normal effort  MSK:   Moves extremities without difficulty  Other:    Medical Decision Making  Given the patient's initial medical screening exam, the following diagnostic evaluation has been ordered. The patient will be placed in the appropriate treatment space, once one is available, to complete the evaluation and treatment. I have discussed the plan of care with the patient and I have advised the patient that an ED physician or mid-level practitioner will reevaluate their condition after the test results have been received, as the results may give them additional insight into the type of treatment they may need.    Diagnostics: Labs, EKG, CXR  Treatments: none immediately   Varney Daily, [place] 05/30/22 1708

## 2022-05-30 NOTE — ED Provider Notes (Signed)
[facility] Provider Note    Event Date/Time   First MD Initiated Contact with Patient 05/30/22 2118     (approximate)   History   Tachycardia and Jaw Pain   HPI  Tammy Vang is a 66 y.o. female who presents to the ED for evaluation of Tachycardia and Jaw Pain   I review ED visit from 11 days ago where patient was evaluated for chest and jaw discomfort, palpitations.  Reproducible pain.   Patient presents to the ED for evaluation of palpitations.  She reports intermittent episodes of palpitations over the past few weeks, similar to when she was here 11 days ago.  Reports that she has had some jaw discomfort since then bilaterally, but none right now.  Reports that she had chest pain few days ago while at rest, but none more recently.  Physical Exam   Triage Vital Signs: ED Triage Vitals  Enc Vitals Group     BP 05/30/22 1640 (!) 127/92     Pulse Rate 05/30/22 1640 (!) 108     Resp 05/30/22 1640 16     Temp 05/30/22 1640 97.9 F (36.6 C)     Temp Source 05/30/22 1640 Oral     SpO2 05/30/22 1640 97 %     Weight 05/30/22 1637 234 lb (106.1 kg)     Height 05/30/22 1637 5\' 8"  (1.727 m)     Head Circumference --      Peak Flow --      Pain Score 05/30/22 1637 0     Pain Loc --      Pain Edu? --      Excl. in GC? --     Most recent vital signs: Vitals:   05/30/22 2130 05/30/22 2151  BP: 124/76   Pulse: 93   Resp: 17   Temp:  97.8 F (36.6 C)  SpO2: 97%     General: Awake, no distress.  Seems anxious, but pleasant and conversational. CV:  Good peripheral perfusion. RRR Resp:  Normal effort.  Abd:  No distention.  MSK:  No deformity noted.  Neuro:  No focal deficits appreciated. Cranial nerves II through XII intact 5/5 strength and sensation in all 4 extremities Other:     ED Results / Procedures / Treatments   Labs (all labs ordered are listed, but only abnormal results are displayed) Labs Reviewed  BASIC METABOLIC PANEL -  Abnormal; Notable for the following components:      Result Value   Glucose, Bld 115 (*)    All other components within normal limits  CBC - Abnormal; Notable for the following components:   RBC 5.41 (*)    Hemoglobin 15.5 (*)    HCT 48.5 (*)    All other components within normal limits  TSH  D-DIMER, QUANTITATIVE  TROPONIN I (HIGH SENSITIVITY)  TROPONIN I (HIGH SENSITIVITY)    EKG Sinus tachycardia rate of 111 bpm.  Low amplitude.  Normal axis and intervals.  No clear signs of acute ischemia.  RADIOLOGY CXR interpreted by me without evidence of acute cardiopulmonary pathology.  Official radiology report(s): DG Chest 2 View  Result Date: 05/30/2022 CLINICAL DATA:  Chest pain. EXAM: CHEST - 2 VIEW COMPARISON:  Radiograph 05/19/2022 FINDINGS: Stable heart size.The cardiomediastinal contours are normal. The lungs are clear. Pulmonary vasculature is normal. No consolidation, pleural effusion, or pneumothorax. No acute osseous abnormalities are seen. IMPRESSION: No acute chest findings. Electronically Signed   By: 07/19/2022.D.  On: 05/30/2022 17:08    PROCEDURES and INTERVENTIONS:  .1-3 Lead EKG Interpretation  Performed by: Delton Prairie, MD Authorized by: Delton Prairie, MD     Interpretation: normal     ECG rate:  88   ECG rate assessment: normal     Rhythm: sinus rhythm     Ectopy: none     Conduction: normal     Medications - No data to display   IMPRESSION / MDM / ASSESSMENT AND PLAN / ED COURSE  I reviewed the triage vital signs and the nursing notes.  Differential diagnosis includes, but is not limited to, hyperthyroid, A-fib with RVR, anxiety, PE, ACS  {Patient presents with symptoms of an acute illness or injury that is potentially life-threatening.  66 year old woman with minimal past medical history presents to the ED with continued palpitations intermittently in a subacute fashion.  Noted to be tachycardic in triage, but this self resolves and remains  with a regular rate and rhythm without signs of dysrhythmias on the monitor while she is on telemetry in the ED.  Normal electrolytes and metabolic panel, no leukocytosis or stigmata of sepsis.  First troponin is negative and her CXR is clear.  TSH.  When discussing possible etiologies of her symptoms, we decided upon a D-dimer to screen for PE and this is pending at the time of signout to oncoming provider.  She will require second troponin and this D-dimer.  May be suitable for outpatient management with a cardiology referral for Holter monitor if this is reassuring.      FINAL CLINICAL IMPRESSION(S) / ED DIAGNOSES   Final diagnoses:  Palpitations     Rx / DC Orders   ED Discharge Orders          Ordered    Ambulatory referral to Cardiology       Comments: If you have not heard from the Cardiology office within the next 72 hours please call [phone].   05/30/22 2322             Note:  This document was prepared using Dragon voice recognition software and may include unintentional dictation errors.   Delton Prairie, MD 05/30/22 2322

## 2022-05-30 NOTE — ED Provider Notes (Signed)
Patient signed out to me at 11 PM pending a D-dimer.  This is a 66 year old female who presents with palpitations shortness of breath no chest pain today.  Work-up overall is reassuring.  She is pending D-dimer.  D-dimer is 0.54.  Using age-adjusted cut off this is in normal range.  We will have patient follow-up with her primary care provider she is appropriate for discharge at this time.   Georga Hacking, MD 05/31/22 [phone]

## 2022-05-30 NOTE — ED Triage Notes (Signed)
Patient c/o three episodes today of rapid heart beat. Reports bilateral jaw pain. The first episode woke her up from her sleep this AM. Denies chest pain or sob

## 2022-05-31 LAB — TROPONIN I (HIGH SENSITIVITY): Troponin I (High Sensitivity): 3 ng/L (ref <18)

## 2022-05-31 LAB — D-DIMER, QUANTITATIVE: D-Dimer, Quant: 0.54 ug/mL-FEU — ABNORMAL HIGH (ref 0.00–0.50)

## 2022-05-31 NOTE — Discharge Instructions (Addendum)
Your blood work including screening for blood clots and heart attacks was all reassuring today.  Please follow-up with your primary care provider or cardiology if you continue to have palpitations.

## 2022-06-15 ENCOUNTER — Ambulatory Visit: Payer: Medicare Other | Admitting: Podiatry

## 2022-06-21 ENCOUNTER — Other Ambulatory Visit: Payer: Self-pay | Admitting: Internal Medicine

## 2022-06-21 DIAGNOSIS — Z1231 Encounter for screening mammogram for malignant neoplasm of breast: Secondary | ICD-10-CM

## 2022-06-22 ENCOUNTER — Ambulatory Visit (INDEPENDENT_AMBULATORY_CARE_PROVIDER_SITE_OTHER): Payer: Medicare Other | Admitting: Podiatry

## 2022-06-22 DIAGNOSIS — M7751 Other enthesopathy of right foot: Secondary | ICD-10-CM | POA: Diagnosis not present

## 2022-06-22 NOTE — Progress Notes (Signed)
Chief Complaint  Patient presents with   Follow-up    Patient is here for right foot pain follow-up.patient states that she is doing better.    HPI: 66 y.o. female presenting today for follow-up evaluation of pain and tenderness to the right lower extremity.  Patient states that overall she is doing much better.  She has resumed physical therapy for her lumbar spine and back.  She also receives injections for her lumbar spine and right hip.  She is not sure if the injection in the foot or the hip helped alleviate her right lower extremity symptoms but overall she has improved.  She enjoys wearing the ankle brace and feels that it gives her good support.   Patient has a history of multiple surgeries.  Course of events: -1st surgery April 2021: [facility]. Dr. Excell Seltzer.  Bunion.  Hammertoes 2-5.  Jeremey Bascom calcaneal osteotomy.  Patient states that she never improved from the original surgery.  She continued to have pain and tenderness to the lateral aspect of the foot. -MRI WO CONTRAST RT ANKLE 08/08/2020: IMPRESSION: 1. Surgical changes involving the calcaneus with a vertical/longitudinal osteotomy through the middle facet region of the calcaneus. The osteotomy site is ununited and displaced dorsally. Moderate surrounding marrow edema. 2. Significant sinus tarsi narrowing by the dorsal displacement of the distal calcaneal osteotomy fragment. 3. Focal significant tendinopathy involving the peroneus brevis tendon with suspected extensive interstitial tear along the lateral margin of the distal calcaneus. No complete tear/rupture. 4. Intact medial and lateral ankle ligaments and medial, lateral and anterior ankle tendons. -2nd surgery January 2022: [facility].  Dr. Ether Griffins.  Creed Kail calcaneal osteotomy revision.  Anticipated peroneal tendon repair however no tendon was repaired intraoperatively  Patient states that she went to get a second opinion from another surgeon and the surgeon had  recommended ankle fusion.     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   Arthritis    knees   PONV (postoperative nausea and vomiting)    after D&C and Cholecystectomy     Physical Exam: General: The patient is alert and oriented x3 in no acute distress.  Dermatology: Skin is warm, dry and supple bilateral lower extremities. Negative for open lesions or macerations.  Skin incisions of healed  Vascular: Palpable pedal pulses bilaterally. No edema or erythema noted. Capillary refill within normal limits.  Neurological: Epicritic and protective threshold grossly intact bilaterally.  There continues to be chronic light pain and tenderness with sharp shooting sensation down to the tips of her toes noted with light stroking of the lateral aspect of her ankle.  Positive Tinel's sign.  Musculoskeletal Exam: There continues to be tenderness along the peroneal tendon right just posterior to the lateral fibular malleolus.  Pain with palpation of the sinus tarsi and inversion and eversion of the subtalar joint as well.  Overall however the patient's pain has improved since last visit  MR ANKLE RT WO CONTRAST 04/23/2022 IMPRESSION: Compared to 08/08/2020:   1. Interval instrumented fixation of the prior displaced osteotomy of the anterior calcaneus. There is similar alignment. There now appears to be osseous fusion. 2. High-grade attenuation of the peroneus brevis tendon starting at the distal aspect of the fibula and involving the prior area of extensive longitudinal partial-thickness tear. Presumably there has been interval tendon debridement. 3. There is again collapse of the sinus tarsi secondary to the  displaced vertical osteotomy within the anterior aspect of the calcaneus.    Assessment: 1.  Peroneal tendinitis right 2.  History of right foot surgery.  Please see above 3.  Capsulitis lateral  aspect of the right ankle 4.  Neuritis/lumbar radiculopathy right lower extremity 5.  Sinus tarsitis right   Plan of Care:  1. Patient evaluated.   2.  No cortisone injection administered today.  She is scheduled for an additional right hip injection and she would like to see if the additional hip injection helped alleviate her right lower extremity symptoms 3.  Continue wearing custom orthotics with good supportive shoes and sneakers  4.  Continue physical therapy for her back.  She states that it is improving 5.  Continue ankle brace daily 6.  Return to clinic as needed   Felecia Shelling, DPM [facility]  Dr. Felecia Shelling, DPM    [address] [place], [place] [address]                Office [phone]  Fax [phone]

## 2022-07-15 ENCOUNTER — Ambulatory Visit
Admission: RE | Admit: 2022-07-15 | Discharge: 2022-07-15 | Disposition: A | Discharge status: Home / Self Care | Payer: Medicare Other | Source: Ambulatory Visit | Attending: Internal Medicine | Admitting: Internal Medicine

## 2022-07-15 DIAGNOSIS — Z1231 Encounter for screening mammogram for malignant neoplasm of breast: Secondary | ICD-10-CM | POA: Insufficient documentation

## 2022-07-16 NOTE — Progress Notes (Signed)
   Telephone Visit- Progress Note: Referring Physician:  Gladstone Lighter, MD [place],  [place] [id number]  Primary Physician:  Gladstone Lighter, MD  This visit was performed via telephone.  Patient location: home Provider location: office  I spent a total of 15 minutes non-face-to-face activities for this visit on the date of this encounter including review of current clinical condition and response to treatment.   Chief Complaint:  follow up of PT/ESIs  History of Present Illness: Tammy Vang is a 66 y.o. female was last seen by Danielle on 04/29/22 for right sided LBP and right anterior leg pain along with some pain in her tailbone.   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   Referral done to Dr. Sharlet Salina and she was to start PT.   She had Right L3-4 transforaminal epidural injection by Dr. Sharlet Salina on 05/28/22 that helped 100% x 2-3 weeks, then pain returned.   She had repeat Right L3-4 transforaminal epidural injection by Dr. Sharlet Salina on 06/25/22 with no improvement.   She was started on lyrica and advised to continue with PT.   She is seeing improvement with PT, especially with traction. She has not started lyrica yet- she wants to discuss with cardiology at her visit next week.   She continues with right sided LBP and right anterior leg pain. No numbness, tingling, or weakness. She is taking prn OTC tylenol.   She is much better than she was when pain started back in May.    Exam: No exam done as this was a telephone encounter.     Imaging: Nothing recent to review.   I have personally reviewed the images and agree with the above interpretation.  Assessment and Plan: Tammy Vang is a pleasant 66 y.o. female with improvement in right sided LBP and right anterior thigh pain.   Did well with first ESI, but second did  not help. Has seen improvement with PT.   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   Treatment options discussed with patient and following plan made:   - Continue with PT for lumbar spine. She is getting good relief.  - Okay to continue with prn OTC tylenol as directed. Take with food.  - Will discuss lyrica with cardiology next week. If symptoms are not better, she may not need to start.  - If she gets worse, will have her see Dr. Izora Ribas to discuss any possible surgery options.  - She would like to do another phone call visit in 6 weeks. Will let me know if anything changes in the interim.   Geronimo Boot PA-C Neurosurgery

## 2022-07-20 ENCOUNTER — Ambulatory Visit (INDEPENDENT_AMBULATORY_CARE_PROVIDER_SITE_OTHER): Payer: Medicare Other | Admitting: Orthopedic Surgery

## 2022-07-20 ENCOUNTER — Encounter: Payer: Self-pay | Admitting: Orthopedic Surgery

## 2022-07-20 DIAGNOSIS — M48061 Spinal stenosis, lumbar region without neurogenic claudication: Secondary | ICD-10-CM | POA: Diagnosis not present

## 2022-07-20 DIAGNOSIS — M5416 Radiculopathy, lumbar region: Secondary | ICD-10-CM | POA: Diagnosis not present

## 2022-07-27 ENCOUNTER — Encounter: Payer: Self-pay | Admitting: Cardiology

## 2022-07-27 ENCOUNTER — Ambulatory Visit: Payer: Medicare Other | Attending: Cardiology | Admitting: Cardiology

## 2022-07-27 VITALS — BP 128/89 | HR 106 | Ht 68.0 in | Wt 234.0 lb

## 2022-07-27 DIAGNOSIS — R002 Palpitations: Secondary | ICD-10-CM | POA: Diagnosis not present

## 2022-07-27 DIAGNOSIS — Z6835 Body mass index (BMI) 35.0-35.9, adult: Secondary | ICD-10-CM | POA: Diagnosis present

## 2022-07-27 NOTE — Progress Notes (Signed)
Ekg

## 2022-07-27 NOTE — Progress Notes (Signed)
Cardiology Office Note:    Date:  07/27/2022   ID:  Tammy Vang, DOB 07-04-1956, MRN [id number]  PCP:  Gladstone Lighter, MD   [facility] Providers Cardiologist:  None     Referring MD: Vladimir Crofts, MD   Chief Complaint  Patient presents with   New Patient (Initial Visit)    Palpitations, Family Hx     History of Present Illness:    Tammy Vang is a 66 y.o. 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  She had symptoms of palpitations about 3 months ago lasting 3 to 4 weeks.  Symptoms began after she used Voltaren gel due to right ankle pain.  She stopped Voltaren and started drinking boost energy drinks.  Symptoms persisted on and off for about 3 weeks.  She stopped to boost energy drinks with resolution of symptoms.  Has not had any symptoms since.  Denies any prior symptoms.  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   Arthritis    knees   PONV (postoperative nausea and vomiting)    after D&C and Cholecystectomy    Past Surgical History:  Procedure Laterality Date   BUNIONECTOMY Right    BUNIONECTOMY Right 01/22/2020   Procedure: BUNIONECTOMY LAPIDUS TYPE + AKIN RIGHT;  Surgeon: Caroline More, DPM;  Location: [place];  Service: Podiatry;  Laterality: Right;  pre op anes. pop/saph block   CHOLECYSTECTOMY     COLONOSCOPY     DILATION AND CURETTAGE OF UTERUS     FLAT FOOT CORRECTION Right 01/22/2020   Procedure: EVANS/MCDO;  Surgeon: Caroline More, DPM;  Location: [place];  Service: Podiatry;  Laterality: Right;   HAMMER TOE SURGERY Right 01/22/2020   Procedure: HAMMER TOE CORRECTION;  Surgeon: Caroline More, DPM;  Location: [place];  Service: Podiatry;  Laterality: Right;   TENDON REPAIR Right 10/24/2020   Procedure: FLEXOR TENDON REPAIR SECONDARY RIGHT; NONUNION REPAIR SECONDARDY RIGHT;  Surgeon: Samara Deist, DPM;   Location: [facility];  Service: Podiatry;  Laterality: Right;    Current Medications: Current Meds  Medication Sig   Biotin 1000 MCG tablet Take 1,000 mcg by mouth daily.   Boswellia-Glucosamine-Vit D (OSTEO BI-FLEX ONE PER DAY PO) Take 1 tablet by mouth daily at 12 noon.   Calcium Carbonate (CALCARB 600 PO) Take 600 mg by mouth daily.   cholecalciferol (VITAMIN D) 25 MCG (1000 UNIT) tablet Take 6,000 Units by mouth daily.   Omega-3 1000 MG CAPS Take 1,000 mg by mouth daily.   prochlorperazine (COMPAZINE) 10 MG tablet Take 1 tablet (10 mg total) by mouth every 6 (six) hours as needed for nausea or vomiting (or headache).   sodium chloride (OCEAN) 0.65 % SOLN nasal spray Place 1 spray into both nostrils 2 (two) times daily.   Current Facility-Administered Medications for the 07/27/22 encounter (Office Visit) with Kate Sable, MD  Medication   betamethasone acetate-betamethasone sodium phosphate (CELESTONE) injection 3 mg     Allergies:   Gluten meal, Lactose, Lactose intolerance (gi), and Voltaren [diclofenac]   Social History   Socioeconomic History   Marital status: Married    Spouse name: Not on file   Number of children: Not on file   Years of education: Not on file   Highest education level: Not on file  Occupational History   Not on file  Tobacco Use  Smoking status: Never   Smokeless tobacco: Never  Vaping Use   Vaping Use: Never used  Substance and Sexual Activity   Alcohol use: Not Currently   Drug use: Never   Sexual activity: Not on file  Other Topics Concern   Not on file  Social History Narrative   Not on file   Social Determinants of Health   Financial Resource Strain: Not on file  Food Insecurity: Not on file  Transportation Needs: Not on file  Physical Activity: Not on file  Stress: Not on file  Social Connections: Not on file     Family History: The patient's family history is not on file.  ROS:   Please see the history of present  illness.     All other systems reviewed and are negative.  EKGs/Labs/Other Studies Reviewed:    The following studies were reviewed today:   EKG:  EKG is  ordered today.  The ekg ordered today demonstrates sinus tachycardia, heart rate 106  Recent Labs: 05/30/2022: BUN 21; Creatinine, Ser 0.92; Hemoglobin 15.5; Platelets 250; Potassium 3.8; Sodium 140; TSH 3.240  Recent Lipid Panel No results found for: "CHOL", "TRIG", "HDL", "CHOLHDL", "VLDL", "LDLCALC", "LDLDIRECT"   Risk Assessment/Calculations:             Physical Exam:    VS:  BP 128/89 (BP Location: Left Arm, Patient Position: Sitting, Cuff Size: Normal)   Pulse (!) 106   Ht 5\' 8"  (1.727 m)   Wt 234 lb (106.1 kg)   SpO2 95%   BMI 35.58 kg/m     Wt Readings from Last 3 Encounters:  07/27/22 234 lb (106.1 kg)  05/30/22 234 lb (106.1 kg)  05/19/22 234 lb (106.1 kg)     GEN:  Well nourished, well developed in no acute distress HEENT: Normal NECK: No JVD; No carotid bruits CARDIAC: RRR, no murmurs, rubs, gallops RESPIRATORY:  Clear to auscultation without rales, wheezing or rhonchi  ABDOMEN: Soft, non-tender, non-distended MUSCULOSKELETAL:  No edema; right ankle brace noted SKIN: Warm and dry NEUROLOGIC:  Alert and oriented x 3 PSYCHIATRIC:  Normal affect   ASSESSMENT:    1. Palpitation   2. BMI 35.0-35.9,adult    PLAN:    In order of problems listed above:  Palpitations, in the context of using Voltaren and boost energy drinks.  Symptoms have resolved since stopping energy drinks and Voltaren.  Monitor for now, if symptoms return, will consider cardiac monitor. Obesity, low-calorie diet, weight loss advised.  Follow-up as needed       Medication Adjustments/Labs and Tests Ordered: Current medicines are reviewed at length with the patient today.  Concerns regarding medicines are outlined above.  Orders Placed This Encounter  Procedures   EKG 12-Lead   No orders of the defined types were placed  in this encounter.   Patient Instructions  Medication Instructions:  Your physician recommends that you continue on your current medications as directed. Please refer to the Current Medication list given to you today.  *If you need a refill on your cardiac medications before your next appointment, please call your pharmacy*    Follow-Up: At [facility], you and your health needs are our priority.  As part of our continuing mission to provide you with exceptional heart care, we have created designated Provider Care Teams.  These Care Teams include your primary Cardiologist (physician) and Advanced Practice Providers (APPs -  Physician Assistants and Nurse Practitioners) who all work together to provide you with the  care you need, when you need it.  We recommend signing up for the patient portal called "MyChart".  Sign up information is provided on this After Visit Summary.  MyChart is used to connect with patients for Virtual Visits (Telemedicine).  Patients are able to view lab/test results, encounter notes, upcoming appointments, etc.  Non-urgent messages can be sent to your provider as well.   To learn more about what you can do with MyChart, go to [email or web address].    Your next appointment:   Follow up as needed   The format for your next appointment:   In Person  Provider:   Debbe Odea, MD    Other Instructions   Important Information About Sugar         Signed, Debbe Odea, MD  07/27/2022 10:24 AM    [facility]

## 2022-07-27 NOTE — Patient Instructions (Signed)
Medication Instructions:   Your physician recommends that you continue on your current medications as directed. Please refer to the Current Medication list given to you today.  *If you need a refill on your cardiac medications before your next appointment, please call your pharmacy*    Follow-Up: At [facility], you and your health needs are our priority.  As part of our continuing mission to provide you with exceptional heart care, we have created designated Provider Care Teams.  These Care Teams include your primary Cardiologist (physician) and Advanced Practice Providers (APPs -  Physician Assistants and Nurse Practitioners) who all work together to provide you with the care you need, when you need it.  We recommend signing up for the patient portal called "MyChart".  Sign up information is provided on this After Visit Summary.  MyChart is used to connect with patients for Virtual Visits (Telemedicine).  Patients are able to view lab/test results, encounter notes, upcoming appointments, etc.  Non-urgent messages can be sent to your provider as well.   To learn more about what you can do with MyChart, go to [email or web address].    Your next appointment:   Follow up as needed   The format for your next appointment:   In Person  Provider:   Brian Agbor-Etang, MD    Other Instructions   Important Information About Sugar       

## 2022-07-28 ENCOUNTER — Inpatient Hospital Stay
Admission: RE | Admit: 2022-07-28 | Discharge: 2022-07-28 | Disposition: A | Discharge status: Home / Self Care | Payer: Self-pay | Source: Ambulatory Visit | Attending: *Deleted | Admitting: *Deleted

## 2022-07-28 ENCOUNTER — Other Ambulatory Visit: Payer: Self-pay | Admitting: *Deleted

## 2022-07-28 DIAGNOSIS — Z1231 Encounter for screening mammogram for malignant neoplasm of breast: Secondary | ICD-10-CM

## 2022-09-09 DIAGNOSIS — D219 Benign neoplasm of connective and other soft tissue, unspecified: Secondary | ICD-10-CM | POA: Insufficient documentation

## 2022-09-17 NOTE — Progress Notes (Unsigned)
   Telephone Visit- Progress Note: Referring Physician:  No referring provider defined for this encounter.  Primary Physician:  Enid Baas, MD  This visit was performed via telephone.  Patient location: home Provider location: office  I spent a total of 10 minutes non-face-to-face activities for this visit on the date of this encounter including review of current clinical condition and response to treatment.  Patient has given verbal consent to this telephone visits and we reviewed the limitations of a telephone visit. Patient wishes to proceed.    Chief Complaint:  follow up   History of Present Illness: Tammy Vang is a 66 y.o. female was last seen for phone visit by me on 07/20/22. 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    She had 2 ESIs with Dr. Yves Dill (right L3-L4 TF)- first helped 100% and second did help at all she was doing PT and was improving. She was to discuss starting lyrica with cardiology.   She is scheduled for a phone visit for follow up.   She is doing great! She has minimal intermittent LBP and right thigh pain. This improves when she does her HEP from PT. She is no longer going to PT, but does her HEP daily.   She talked to her cardiologist and elected not to start lyrica.    Exam: No exam done as this was a telephone encounter.     Imaging: Nothing recent to review.   I have personally reviewed the images and agree with the above interpretation.  Assessment and Plan: Tammy Vang is a pleasant 66 y.o. female with great improvement in right sided LBP and right anterior thigh pain. She has only minimal intermittent pain that improves with her HEP.   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   Treatment options discussed with patient and following plan made:   - Continue with HEP from PT.  - Agree with not staring lyrica as she is doing so well.  - If she gets worse, consider revisiting formal PT.  - She likely is a candidate for surgery if she got worse.  - She will f/u prn at her request.   Tammy Vang

## 2022-09-20 ENCOUNTER — Ambulatory Visit (INDEPENDENT_AMBULATORY_CARE_PROVIDER_SITE_OTHER): Payer: Medicare Other | Admitting: Orthopedic Surgery

## 2022-09-20 ENCOUNTER — Encounter: Payer: Self-pay | Admitting: Orthopedic Surgery

## 2022-09-20 DIAGNOSIS — M5416 Radiculopathy, lumbar region: Secondary | ICD-10-CM | POA: Diagnosis not present

## 2022-09-20 DIAGNOSIS — M48061 Spinal stenosis, lumbar region without neurogenic claudication: Secondary | ICD-10-CM | POA: Diagnosis not present

## 2022-11-22 ENCOUNTER — Ambulatory Visit (INDEPENDENT_AMBULATORY_CARE_PROVIDER_SITE_OTHER): Payer: Medicare Other

## 2022-11-22 ENCOUNTER — Ambulatory Visit (INDEPENDENT_AMBULATORY_CARE_PROVIDER_SITE_OTHER): Payer: Medicare Other | Admitting: Podiatry

## 2022-11-22 ENCOUNTER — Telehealth: Payer: Self-pay | Admitting: Urology

## 2022-11-22 DIAGNOSIS — T8484XA Pain due to internal orthopedic prosthetic devices, implants and grafts, initial encounter: Secondary | ICD-10-CM

## 2022-11-22 DIAGNOSIS — M7751 Other enthesopathy of right foot: Secondary | ICD-10-CM

## 2022-11-22 NOTE — Progress Notes (Signed)
Chief Complaint  Patient presents with   Foot Pain    Patient came in today for right foot pain, patient states it feels like her screw is hurting her foot in the bunion and top of the foot, patient has pain and is unable to wear tri-lock brace, so her ankle is hurting also,     HPI: 67 y.o. female presenting today for follow-up evaluation of pain and tenderness to the right lower extremity.  Patient states that she is experiencing pain and tenderness associated to the surgical sites of the right foot.  She also states that she has had chronic peroneal tendon pain as well which has not resolved.  She says that now the ankle brace that she wears irritates the hardware which is palpable.  She presents for further treatment and evaluation  Patient has a history of multiple surgeries.  Course of events: -1st surgery April 2021: [facility]. Dr. Luana Shu.  Bunion.  Hammertoes 2-5.  Chandlor Noecker calcaneal osteotomy.  Patient states that she never improved from the original surgery.  She continued to have pain and tenderness to the lateral aspect of the foot. -MRI WO CONTRAST RT ANKLE 08/08/2020: IMPRESSION: 1. Surgical changes involving the calcaneus with a vertical/longitudinal osteotomy through the middle facet region of the calcaneus. The osteotomy site is ununited and displaced dorsally. Moderate surrounding marrow edema. 2. Significant sinus tarsi narrowing by the dorsal displacement of the distal calcaneal osteotomy fragment. 3. Focal significant tendinopathy involving the peroneus brevis tendon with suspected extensive interstitial tear along the lateral margin of the distal calcaneus. No complete tear/rupture. 4. Intact medial and lateral ankle ligaments and medial, lateral and anterior ankle tendons. -2nd surgery January 2022: [facility].  Dr. Vickki Muff.  Thaily Hackworth calcaneal osteotomy revision.  Anticipated peroneal tendon repair however no tendon was repaired intraoperatively  Patient states  that she went to get a second opinion from another surgeon and the surgeon had recommended ankle fusion.     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   Arthritis    knees   PONV (postoperative nausea and vomiting)    after D&C and Cholecystectomy     Physical Exam: General: The patient is alert and oriented x3 in no acute distress.  Dermatology: Skin is warm, dry and supple bilateral lower extremities. Negative for open lesions or macerations.  Skin incisions of healed  Vascular: Palpable pedal pulses bilaterally. No edema or erythema noted. Capillary refill within normal limits.  Neurological: Epicritic and protective threshold grossly intact bilaterally.  There continues to be chronic light pain and tenderness with sharp shooting sensation down to the tips of her toes noted with light stroking of the lateral aspect of her ankle.  Positive Tinel's sign.  Musculoskeletal Exam: There continues to be tenderness along the peroneal tendon right just posterior to the lateral fibular malleolus.  Significant tenderness with palpation to the hardware which is palpable to the first TMT as well as the hardware along the lateral portion of the calcaneus.  MR ANKLE RT WO CONTRAST 04/23/2022 IMPRESSION: Compared to 08/08/2020:   1. Interval instrumented fixation of the prior displaced osteotomy of the anterior calcaneus. There is similar alignment. There now appears to be osseous fusion. 2. High-grade attenuation of the peroneus brevis tendon starting at the distal aspect of the fibula and involving the prior area of extensive longitudinal partial-thickness tear. Presumably there has been  interval tendon debridement. 3. There is again collapse of the sinus tarsi secondary to the displaced vertical osteotomy within the anterior aspect of the calcaneus.    Assessment: 1.  Peroneal tendinitis right;  chronic 2.  History of right foot surgery.  Please see above 3.  Capsulitis lateral aspect of the right ankle; currently asymptomatic 4.  Neuritis/lumbar radiculopathy right lower extremity 5.  Symptomatic painful hardware right first TMT and calcaneus   Plan of Care:  1. Patient evaluated.   2.  No cortisone injection administered today.   3.  Continue wearing custom orthotics with good supportive shoes and sneakers  4.  Continue ankle brace daily 5.  Unfortunately the patient has had chronic pain and tenderness associated to the hardware of the first TMT which is palpable as well as the hardware with direct palpation of the anterior portion of the calcaneus.  Patient states that the pain is ongoing and only getting worse.  She says with an ankle brace that this irritates the hardware areas and is very symptomatic.  We discussed today removal of the hardware and the patient is in agreement.  Risk benefits advantages and disadvantages were also explained.  No guarantees were expressed or implied. 6.  In regards to the chronic peroneal tendinitis of the right lower extremity, she continues to have pain and tenderness to the posterior aspect of the lateral malleolus right ankle.  We also discussed in detail today exploration of the peroneal tendons with possible peroneal tendon repair/tenodesis.  This was discussed in detail which would change her postoperative recovery versus simple hardware removal.  She understands that she would need to be nonweightbearing in a cam boot for approximately 4 weeks postoperatively if we addressed the peroneal tendons and the need to repair. 7.  Authorization for surgery was initiated today.  Surgery will consist of removal of hardware first TMT and calcaneus right.  Possible exploration of peroneal tendons with possible repair or tenodesis right.  Patient and husband will both think about whether they would like to have the peroneal tendons addressed intraoperatively or  just simple removal of the hardware 8.  Return to clinic 1 week postop   Edrick Kins, DPM [facility]  Dr. Edrick Kins, DPM    [address], [place] [id number]                Office [phone]  Fax [phone]

## 2022-11-22 NOTE — Telephone Encounter (Signed)
DOS - 12/16/22   REMOVAL FIXATION DEEP X'S 2 RIGHT --- 20680 TENODESIS RIGHT --- [place] WITH [employer] AND SHE STATED THAT SINCE MEDICARE A&B IS PRIMARY INSURANCE THAT NO PRIOR AUTH IS REQUIRED THROUGH [employer].  REF # [id number]

## 2022-12-21 ENCOUNTER — Encounter: Payer: Medicare Other | Admitting: Podiatry

## 2022-12-28 ENCOUNTER — Encounter: Payer: Medicare Other | Admitting: Podiatry

## 2023-01-11 ENCOUNTER — Encounter: Payer: Medicare Other | Admitting: Podiatry

## 2023-04-12 ENCOUNTER — Ambulatory Visit: Payer: Medicare Other | Admitting: Dermatology

## 2023-04-12 VITALS — BP 114/83 | HR 100

## 2023-04-12 DIAGNOSIS — I781 Nevus, non-neoplastic: Secondary | ICD-10-CM | POA: Diagnosis not present

## 2023-04-12 DIAGNOSIS — Z1283 Encounter for screening for malignant neoplasm of skin: Secondary | ICD-10-CM

## 2023-04-12 DIAGNOSIS — L700 Acne vulgaris: Secondary | ICD-10-CM | POA: Diagnosis not present

## 2023-04-12 DIAGNOSIS — D692 Other nonthrombocytopenic purpura: Secondary | ICD-10-CM | POA: Diagnosis not present

## 2023-04-12 DIAGNOSIS — D2272 Melanocytic nevi of left lower limb, including hip: Secondary | ICD-10-CM

## 2023-04-12 DIAGNOSIS — L821 Other seborrheic keratosis: Secondary | ICD-10-CM

## 2023-04-12 DIAGNOSIS — Q825 Congenital non-neoplastic nevus: Secondary | ICD-10-CM

## 2023-04-12 DIAGNOSIS — L820 Inflamed seborrheic keratosis: Secondary | ICD-10-CM | POA: Diagnosis not present

## 2023-04-12 DIAGNOSIS — W908XXA Exposure to other nonionizing radiation, initial encounter: Secondary | ICD-10-CM

## 2023-04-12 DIAGNOSIS — L578 Other skin changes due to chronic exposure to nonionizing radiation: Secondary | ICD-10-CM

## 2023-04-12 DIAGNOSIS — D229 Melanocytic nevi, unspecified: Secondary | ICD-10-CM

## 2023-04-12 DIAGNOSIS — L814 Other melanin hyperpigmentation: Secondary | ICD-10-CM

## 2023-04-12 NOTE — Patient Instructions (Addendum)
Cryotherapy Aftercare  Wash gently with soap and water everyday.   Apply Vaseline and Band-Aid daily until healed.   Melanoma ABCDEs  Melanoma is the most dangerous type of skin cancer, and is the leading cause of death from skin disease.  You are more likely to develop melanoma if you: Have light-colored skin, light-colored eyes, or red or blond hair Spend a lot of time in the sun Tan regularly, either outdoors or in a tanning bed Have had blistering sunburns, especially during childhood Have a close family member who has had a melanoma Have atypical moles or large birthmarks  Early detection of melanoma is key since treatment is typically straightforward and cure rates are extremely high if we catch it early.   The first sign of melanoma is often a change in a mole or a new dark spot.  The ABCDE system is a way of remembering the signs of melanoma.  A for asymmetry:  The two halves do not match. B for border:  The edges of the growth are irregular. C for color:  A mixture of colors are present instead of an even brown color. D for diameter:  Melanomas are usually (but not always) greater than 6mm - the size of a pencil eraser. E for evolution:  The spot keeps changing in size, shape, and color.  Please check your skin once per month between visits. You can use a small mirror in front and a large mirror behind you to keep an eye on the back side or your body.   If you see any new or changing lesions before your next follow-up, please call to schedule a visit.  Please continue daily skin protection including broad spectrum sunscreen SPF 30+ to sun-exposed areas, reapplying every 2 hours as needed when you're outdoors.   Staying in the shade or wearing long sleeves, sun glasses (UVA+UVB protection) and wide brim hats (4-inch brim around the entire circumference of the hat) are also recommended for sun protection.      Due to recent changes in healthcare laws, you may see results of  your pathology and/or laboratory studies on MyChart before the doctors have had a chance to review them. We understand that in some cases there may be results that are confusing or concerning to you. Please understand that not all results are received at the same time and often the doctors may need to interpret multiple results in order to provide you with the best plan of care or course of treatment. Therefore, we ask that you please give us 2 business days to thoroughly review all your results before contacting the office for clarification. Should we see a critical lab result, you will be contacted sooner.   If You Need Anything After Your Visit  If you have any questions or concerns for your doctor, please call our main line at [phone] and press option 4 to reach your doctor's medical assistant. If no one answers, please leave a voicemail as directed and we will return your call as soon as possible. Messages left after 4 pm will be answered the following business day.   You may also send us a message via MyChart. We typically respond to MyChart messages within 1-2 business days.  For prescription refills, please ask your pharmacy to contact our office. Our fax number is [phone].  If you have an urgent issue when the clinic is closed that cannot wait until the next business day, you can page your doctor at the number   below.    Please note that while we do our best to be available for urgent issues outside of office hours, we are not available 24/7.   If you have an urgent issue and are unable to reach us, you may choose to seek medical care at your doctor's office, retail clinic, urgent care center, or emergency room.  If you have a medical emergency, please immediately call 911 or go to the emergency department.  Pager Numbers  - Dr. Kowalski: [phone]  - Dr. Moye: [phone]  - Dr. Stewart: [phone]  In the event of inclement weather, please call our main line at  [phone] for an update on the status of any delays or closures.  Dermatology Medication Tips: Please keep the boxes that topical medications come in in order to help keep track of the instructions about where and how to use these. Pharmacies typically print the medication instructions only on the boxes and not directly on the medication tubes.   If your medication is too expensive, please contact our office at [phone] option 4 or send us a message through MyChart.   We are unable to tell what your co-pay for medications will be in advance as this is different depending on your insurance coverage. However, we may be able to find a substitute medication at lower cost or fill out paperwork to get insurance to cover a needed medication.   If a prior authorization is required to get your medication covered by your insurance company, please allow us 1-2 business days to complete this process.  Drug prices often vary depending on where the prescription is filled and some pharmacies may offer cheaper prices.  The website www.goodrx.com contains coupons for medications through different pharmacies. The prices here do not account for what the cost may be with help from insurance (it may be cheaper with your insurance), but the website can give you the price if you did not use any insurance.  - You can print the associated coupon and take it with your prescription to the pharmacy.  - You may also stop by our office during regular business hours and pick up a GoodRx coupon card.  - If you need your prescription sent electronically to a different pharmacy, notify our office through [facility] MyChart or by phone at [phone] option 4.     Si Usted Necesita Algo Despus de Su Visita  Tambin puede enviarnos un mensaje a travs de MyChart. Por lo general respondemos a los mensajes de MyChart en el transcurso de 1 a 2 das hbiles.  Para renovar recetas, por favor pida a su farmacia que se  ponga en contacto con nuestra oficina. Nuestro nmero de fax es el [phone].  Si tiene un asunto urgente cuando la clnica est cerrada y que no puede esperar hasta el siguiente da hbil, puede llamar/localizar a su doctor(a) al nmero que aparece a continuacin.   Por favor, tenga en cuenta que aunque hacemos todo lo posible para estar disponibles para asuntos urgentes fuera del horario de oficina, no estamos disponibles las 24 horas del da, los 7 das de la semana.   Si tiene un problema urgente y no puede comunicarse con nosotros, puede optar por buscar atencin mdica  en el consultorio de su doctor(a), en una clnica privada, en un centro de atencin urgente o en una sala de emergencias.  Si tiene una emergencia mdica, por favor llame inmediatamente al 911 o vaya a la sala de emergencias.  Nmeros de bper  -   Dr. Kowalski: [phone]  - Dra. Moye: [phone]  - Dra. Stewart: [phone]  En caso de inclemencias del tiempo, por favor llame a nuestra lnea principal al [phone] para una actualizacin sobre el estado de cualquier retraso o cierre.  Consejos para la medicacin en dermatologa: Por favor, guarde las cajas en las que vienen los medicamentos de uso tpico para ayudarle a seguir las instrucciones sobre dnde y cmo usarlos. Las farmacias generalmente imprimen las instrucciones del medicamento slo en las cajas y no directamente en los tubos del medicamento.   Si su medicamento es muy caro, por favor, pngase en contacto con nuestra oficina llamando al [phone] y presione la opcin 4 o envenos un mensaje a travs de MyChart.   No podemos decirle cul ser su copago por los medicamentos por adelantado ya que esto es diferente dependiendo de la cobertura de su seguro. Sin embargo, es posible que podamos encontrar un medicamento sustituto a menor costo o llenar un formulario para que el seguro cubra el medicamento que se considera necesario.   Si se requiere  una autorizacin previa para que su compaa de seguros cubra su medicamento, por favor permtanos de 1 a 2 das hbiles para completar este proceso.  Los precios de los medicamentos varan con frecuencia dependiendo del lugar de dnde se surte la receta y alguna farmacias pueden ofrecer precios ms baratos.  El sitio web www.goodrx.com tiene cupones para medicamentos de diferentes farmacias. Los precios aqu no tienen en cuenta lo que podra costar con la ayuda del seguro (puede ser ms barato con su seguro), pero el sitio web puede darle el precio si no utiliz ningn seguro.  - Puede imprimir el cupn correspondiente y llevarlo con su receta a la farmacia.  - Tambin puede pasar por nuestra oficina durante el horario de atencin regular y recoger una tarjeta de cupones de GoodRx.  - Si necesita que su receta se enve electrnicamente a una farmacia diferente, informe a nuestra oficina a travs de MyChart de [place] o por telfono llamando al [phone] y presione la opcin 4.  

## 2023-04-12 NOTE — Progress Notes (Signed)
New Patient Visit   Subjective  Tammy Vang is a 67 y.o. female who presents for the following: Skin Cancer Screening and Full Body Skin Exam  The patient presents for Total-Body Skin Exam (TBSE) for skin cancer screening and mole check. The patient has spots, moles and lesions to be evaluated, some may be new or changing. No history of skin cancer.      The following portions of the chart were reviewed this encounter and updated as appropriate: medications, allergies, medical history  Review of Systems:  No other skin or systemic complaints except as noted in HPI or Assessment and Plan.  Objective  Well appearing patient in no apparent distress; mood and affect are within normal limits.  A full examination was performed including scalp, head, eyes, ears, nose, lips, neck, chest, axillae, abdomen, back, buttocks, bilateral upper extremities, bilateral lower extremities, hands, feet, fingers, toes, fingernails, and toenails. All findings within normal limits unless otherwise noted below.   Relevant physical exam findings are noted in the Assessment and Plan.  Left Anterior Thigh Violaceous patch  L pretibia 10.0 x 8.0 mm pink scaly papule with healing excoriation    Assessment & Plan   LENTIGINES, SEBORRHEIC KERATOSES, HEMANGIOMAS - Benign normal skin lesions - Benign-appearing - Call for any changes  MELANOCYTIC NEVI - Tan-brown and/or pink-flesh-colored symmetric macules and papules - L post thigh 2.0 mm med dark brown macule - Benign appearing on exam today - Observation - Call clinic for new or changing moles - Recommend daily use of broad spectrum spf 30+ sunscreen to sun-exposed areas.   ACTINIC DAMAGE - Chronic condition, secondary to cumulative UV/sun exposure - diffuse scaly erythematous macules with underlying dyspigmentation - Recommend daily broad spectrum sunscreen SPF 30+ to sun-exposed areas, reapply every 2 hours as needed.  - Staying in the shade or  wearing long sleeves, sun glasses (UVA+UVB protection) and wide brim hats (4-inch brim around the entire circumference of the hat) are also recommended for sun protection.  - Call for new or changing lesions.  SKIN CANCER SCREENING PERFORMED TODAY.  Vascular birthmark Left Anterior Thigh  Benign, observe.    Inflamed seborrheic keratosis L pretibia  Possible hx of bite or scratch in this area. Recheck on follow-up.  If not clear in a couple of months, patient to return to clinic.   Destruction of lesion - L pretibia  Destruction method: cryotherapy   Informed consent: discussed and consent obtained   Lesion destroyed using liquid nitrogen: Yes   Region frozen until ice ball extended beyond lesion: Yes   Outcome: patient tolerated procedure well with no complications   Post-procedure details: wound care instructions given   Additional details:  Prior to procedure, discussed risks of blister formation, small wound, skin dyspigmentation, or rare scar following cryotherapy. Recommend Vaseline ointment to treated areas while healing.   Purpura - Chronic; persistent and recurrent.  Treatable, but not curable. - Violaceous macules and patches - Benign - Related to trauma, age, sun damage and/or use of blood thinners, chronic use of topical and/or oral steroids - Observe - Can use OTC arnica containing moisturizer such as Dermend Bruise Formula if desired - Call for worsening or other concerns  TELANGIECTASIA Exam: dilated blood vessel of the left antihelix, nose  Treatment Plan: Benign appearing on exam Call for changes  Open Comedone   Open comedone of the left medial zygoma. Hx of sac removed in this area years ago.   Benign-appearing. Observe.  Return in about  2 years (around 04/11/2025).  ICherlyn Labella, CMA, am acting as scribe for Willeen Niece, MD .   Documentation: I have reviewed the above documentation for accuracy and completeness, and I agree with the  above.  Willeen Niece, MD

## 2023-05-20 ENCOUNTER — Ambulatory Visit (INDEPENDENT_AMBULATORY_CARE_PROVIDER_SITE_OTHER): Payer: Medicare Other | Admitting: Podiatry

## 2023-05-20 ENCOUNTER — Encounter: Payer: Self-pay | Admitting: Podiatry

## 2023-05-20 ENCOUNTER — Ambulatory Visit (INDEPENDENT_AMBULATORY_CARE_PROVIDER_SITE_OTHER): Payer: Medicare Other

## 2023-05-20 DIAGNOSIS — M2011 Hallux valgus (acquired), right foot: Secondary | ICD-10-CM | POA: Diagnosis not present

## 2023-05-20 DIAGNOSIS — M2031 Hallux varus (acquired), right foot: Secondary | ICD-10-CM | POA: Diagnosis not present

## 2023-05-20 NOTE — Progress Notes (Unsigned)
Chief Complaint  Patient presents with   Nail Problem    "I have toenail bed pain.  The toenails looks like it's coming up, it has a hump in it."   Toe Pain    "My toe is going outward.  It hurts on the bottom of the toe.  Where the old bunion was, that I had surgery on it hurts on that bone. I get sharp pains there."    HPI: 67 y.o. female presenting today for follow-up evaluation of pain and tenderness to the right lower extremity.  She also states that she has had chronic peroneal tendon pain as well which has not resolved.  She says that now the ankle brace that she wears irritates the hardware which is palpable.  Patient has noticed since last visit that she is actually developed varus deformity of the great toe joint.  She was concerned today and presents for further treatment and evaluation.  She is currently scheduled for surgery upcoming in the next few weeks  Patient has a history of multiple surgeries.  Course of events: -1st surgery April 2021: [facility]. Dr. Excell Seltzer.  Bunion.  Hammertoes 2-5.   calcaneal osteotomy.  Patient states that she never improved from the original surgery.  She continued to have pain and tenderness to the lateral aspect of the foot. -MRI WO CONTRAST RT ANKLE 08/08/2020: IMPRESSION: 1. Surgical changes involving the calcaneus with a vertical/longitudinal osteotomy through the middle facet region of the calcaneus. The osteotomy site is ununited and displaced dorsally. Moderate surrounding marrow edema. 2. Significant sinus tarsi narrowing by the dorsal displacement of the distal calcaneal osteotomy fragment. 3. Focal significant tendinopathy involving the peroneus brevis tendon with suspected extensive interstitial tear along the lateral margin of the distal calcaneus. No complete tear/rupture. 4. Intact medial and lateral ankle ligaments and medial, lateral and anterior ankle tendons. -2nd surgery January 2022: [facility].  Dr. Ether Griffins.    calcaneal osteotomy revision.  Anticipated peroneal tendon repair however no tendon was repaired intraoperatively  Patient states that she went to get a second opinion from another surgeon and the surgeon had recommended ankle fusion.     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   Arthritis    knees   PONV (postoperative nausea and vomiting)    after D&C and Cholecystectomy     Physical Exam: General: The patient is alert and oriented x3 in no acute distress.  Dermatology: Skin is warm, dry and supple bilateral lower extremities. Negative for open lesions or macerations.  Skin incisions of healed  Vascular: Palpable pedal pulses bilaterally. No edema or erythema noted. Capillary refill within normal limits.  Neurological: Light touch and protective threshold grossly intact bilaterally.  There continues to be chronic light pain and tenderness with sharp shooting sensation down to the tips of her toes noted with light stroking of the lateral aspect of her ankle.  Positive Tinel's sign.  Musculoskeletal Exam: There continues to be tenderness along the peroneal tendon right just posterior to the lateral fibular malleolus.  Significant tenderness with palpation to the hardware which is palpable to the first TMT as well as the hardware along the lateral portion of the calcaneus. New finding of hallux varus deformity noted to the right foot exacerbated by weightbearing.  Radiographic exam RT foot 05/20/2023: Compression staple noted within the base of the proximal phalanx.  Orthopedic  screws and plate also noted to the first TMT.  Orthopedic plate and screws also noted to the lateral aspect of the calcaneus as well.  All hardware appears stable.  The most distal portion of the calcaneal plate does appear to be intruding into the CC joint.  Cortical irregularity noted around this area.  MR ANKLE RT WO  CONTRAST 04/23/2022 IMPRESSION: Compared to 08/08/2020: 1. Interval instrumented fixation of the prior displaced osteotomy of the anterior calcaneus. There is similar alignment. There now appears to be osseous fusion. 2. High-grade attenuation of the peroneus brevis tendon starting at the distal aspect of the fibula and involving the prior area of extensive longitudinal partial-thickness tear. Presumably there has been interval tendon debridement. 3. There is again collapse of the sinus tarsi secondary to the displaced vertical osteotomy within the anterior aspect of the calcaneus.    Assessment: 1.  Peroneal tendinitis right; chronic 2.  History of right foot surgery.  Please see above 3.  Capsulitis lateral aspect of the right ankle; currently asymptomatic 4.  Neuritis/lumbar radiculopathy right lower extremity 5.  Symptomatic painful hardware right first TMT and calcaneus 6. New onset of hallux varus right foot   Plan of Care:  -Patient evaluated.  Xrays reviewed  -Continue ankle brace daily -Patient continues to have chronic pain and tenderness associated to the hardware of the first TMT which is palpable as well as the hardware with direct palpation of the anterior portion of the calcaneus.  Patient states that the pain is ongoing and only getting worse.  She says with an ankle brace that this irritates the hardware areas and is very symptomatic.  We discussed today complete removal of all of the hardware of the foot.   -In addition to the planned procedures, I did discuss the pathology of the first MTP and hallux valgus deformity.  Unfortunately due to the acute onset of the hallux varus deformity I do believe that the joint is unstable and would benefit from a first MTP fusion.  Risk benefits advantages and disadvantages were also explained.  No guarantees were expressed or implied.  After long discussion with the patient and her husband they are in agreement for a first MTP fusion of  the right great toe joint. -In regards to the chronic peroneal tendinitis of the right lower extremity, she continues to have pain and tenderness to the posterior aspect of the lateral malleolus right ankle.  After removal of the hardware of the calcaneus I will evaluate the peroneal tendons and repair if needed.  Again this was discussed in detail which would change her postoperative recovery versus simple hardware removal.  She understands that she would need to be nonweightbearing in a cam boot for approximately 4 weeks postoperatively if we addressed the peroneal tendons and the need to repair. -Authorization for surgery was addended today.  Surgery will consist of removal of all hardware right foot.  Possible exploration of peroneal tendons with possible repair or tenodesis right.  First MTP arthrodesis right foot.  -Return to clinic 1 week postop   Felecia Shelling, DPM [facility]  Dr. Felecia Shelling, DPM    [address] [place] [place], [place] [address]  Office [phone]  Fax [phone]

## 2023-05-26 ENCOUNTER — Other Ambulatory Visit: Payer: Self-pay | Admitting: Podiatry

## 2023-05-26 DIAGNOSIS — Z4889 Encounter for other specified surgical aftercare: Secondary | ICD-10-CM | POA: Diagnosis not present

## 2023-05-26 DIAGNOSIS — M2031 Hallux varus (acquired), right foot: Secondary | ICD-10-CM | POA: Diagnosis not present

## 2023-05-26 MED ORDER — IBUPROFEN 800 MG PO TABS: 800.0000 mg | ORAL_TABLET | Freq: Three times a day (TID) | ORAL | 1 refills | Status: DC

## 2023-05-26 MED ORDER — OXYCODONE-ACETAMINOPHEN 5-325 MG PO TABS: 1.0000 | ORAL_TABLET | ORAL | 0 refills | Status: DC | PRN

## 2023-05-26 NOTE — Progress Notes (Signed)
PRN postop 

## 2023-06-01 ENCOUNTER — Encounter: Payer: Medicare Other | Admitting: Podiatry

## 2023-06-03 ENCOUNTER — Ambulatory Visit (INDEPENDENT_AMBULATORY_CARE_PROVIDER_SITE_OTHER): Payer: Medicare Other | Admitting: Podiatry

## 2023-06-03 ENCOUNTER — Encounter: Payer: Self-pay | Admitting: Podiatry

## 2023-06-03 ENCOUNTER — Ambulatory Visit (INDEPENDENT_AMBULATORY_CARE_PROVIDER_SITE_OTHER): Payer: Medicare Other

## 2023-06-03 DIAGNOSIS — M2031 Hallux varus (acquired), right foot: Secondary | ICD-10-CM

## 2023-06-03 DIAGNOSIS — T8484XA Pain due to internal orthopedic prosthetic devices, implants and grafts, initial encounter: Secondary | ICD-10-CM

## 2023-06-03 DIAGNOSIS — Z9889 Other specified postprocedural states: Secondary | ICD-10-CM

## 2023-06-03 NOTE — Addendum Note (Signed)
Addended by: Enedina Finner on: 06/03/2023 10:34 AM   Modules accepted: Orders

## 2023-06-03 NOTE — Progress Notes (Signed)
   Chief Complaint  Patient presents with   Routine Post Op    POV # 1 DOS 05/26/2023 REMOVAL FIXATION DEEP X'S 2 RIGHT, POSSIBLE EXPLORATION WITH REPAIR OF PERONEAL TENDON RT "I have some shooting pains. Pain level is about 2.5."    Subjective:  Patient presents today status post removal of orthopedic hardware with first MTP implant right foot.  DOS: 05/26/2023.  Patient doing very well.  NWB in the cam boot as instructed using a wheelchair and walker.  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   Arthritis    knees   PONV (postoperative nausea and vomiting)    after D&C and Cholecystectomy    Past Surgical History:  Procedure Laterality Date   BUNIONECTOMY Right    BUNIONECTOMY Right 01/22/2020   Procedure: BUNIONECTOMY LAPIDUS TYPE + AKIN RIGHT;  Surgeon: Rosetta Posner, DPM;  Location: [facility];  Service: Podiatry;  Laterality: Right;  pre op anes. pop/saph block   CHOLECYSTECTOMY     COLONOSCOPY     DILATION AND CURETTAGE OF UTERUS     FLAT FOOT CORRECTION Right 01/22/2020   Procedure: Syrianna Schillaci/MCDO;  Surgeon: Rosetta Posner, DPM;  Location: [facility];  Service: Podiatry;  Laterality: Right;   HAMMER TOE SURGERY Right 01/22/2020   Procedure: HAMMER TOE CORRECTION;  Surgeon: Rosetta Posner, DPM;  Location: [facility];  Service: Podiatry;  Laterality: Right;   TENDON REPAIR Right 10/24/2020   Procedure: FLEXOR TENDON REPAIR SECONDARY RIGHT; NONUNION REPAIR SECONDARDY RIGHT;  Surgeon: [name], DPM;  Location: [facility];  Service: Podiatry;  Laterality: Right;    Allergies  Allergen Reactions   Gluten Meal Diarrhea    Also, - Chills, "sweats", weakness   Lactose     Other reaction(s): Abdominal Pain   Lactose Intolerance (Gi) Other (See Comments)    Stomach cramps   Voltaren [Diclofenac]     Objective/Physical Exam Neurovascular status  intact.  Incision well coapted with staples intact. No sign of infectious process noted. No dehiscence. No active bleeding noted.  May edema noted to the surgical extremity.  Overall well-healing surgical foot  Radiographic Exam RT foot 06/03/2023:  Interval removal of the orthopedic hardware to the calcaneus and first TMT.  Good alignment of the first ray with orthopedic hardware to the first MTP intact and stable.  Assessment: 1. s/p ROH RT foot w/ first MTP arthrodesis. DOS: 05/26/2023   Plan of Care:  -Patient was evaluated. X-rays reviewed - Dressings changed.  CDI x 1 week -Continue NWB CAM boot using wheelchair or walker -RTC 1 week   Felecia Shelling, DPM [facility]  Dr. Felecia Shelling, DPM    [address] [place], [place] [address]                Office [phone]  Fax [phone]

## 2023-06-08 ENCOUNTER — Encounter: Payer: Medicare Other | Admitting: Podiatry

## 2023-06-10 ENCOUNTER — Ambulatory Visit (INDEPENDENT_AMBULATORY_CARE_PROVIDER_SITE_OTHER): Payer: Medicare Other | Admitting: Podiatry

## 2023-06-10 ENCOUNTER — Encounter: Payer: Self-pay | Admitting: Podiatry

## 2023-06-10 DIAGNOSIS — T8484XA Pain due to internal orthopedic prosthetic devices, implants and grafts, initial encounter: Secondary | ICD-10-CM

## 2023-06-10 NOTE — Progress Notes (Signed)
   Chief Complaint  Patient presents with   Routine Post Op     POV # 2 DOS 05/26/2023 --- REMOVAL OF HARDWARE RIGHT FOOT, POSSIBLE EXPLORATION WITH REPAIR PERONEAL TENDON RIGHT  "I think pretty good.  I get surges of pain.  They don't last long    Subjective:  Patient presents today status post removal of orthopedic hardware with first MTP implant right foot.  DOS: 05/26/2023.  Patient is to do very well.  NWB in the cam boot as instructed.  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   Arthritis    knees   PONV (postoperative nausea and vomiting)    after D&C and Cholecystectomy    Past Surgical History:  Procedure Laterality Date   BUNIONECTOMY Right    BUNIONECTOMY Right 01/22/2020   Procedure: BUNIONECTOMY LAPIDUS TYPE + AKIN RIGHT;  Surgeon: Rosetta Posner, DPM;  Location: [facility];  Service: Podiatry;  Laterality: Right;  pre op anes. pop/saph block   CHOLECYSTECTOMY     COLONOSCOPY     DILATION AND CURETTAGE OF UTERUS     FLAT FOOT CORRECTION Right 01/22/2020   Procedure: Rorie Delmore/MCDO;  Surgeon: Rosetta Posner, DPM;  Location: [facility];  Service: Podiatry;  Laterality: Right;   HAMMER TOE SURGERY Right 01/22/2020   Procedure: HAMMER TOE CORRECTION;  Surgeon: Rosetta Posner, DPM;  Location: [facility];  Service: Podiatry;  Laterality: Right;   TENDON REPAIR Right 10/24/2020   Procedure: FLEXOR TENDON REPAIR SECONDARY RIGHT; NONUNION REPAIR SECONDARDY RIGHT;  Surgeon: [name], DPM;  Location: [facility];  Service: Podiatry;  Laterality: Right;    Allergies  Allergen Reactions   Gluten Meal Diarrhea    Also, - Chills, "sweats", weakness   Lactose     Other reaction(s): Abdominal Pain   Lactose Intolerance (Gi) Other (See Comments)    Stomach cramps   Voltaren [Diclofenac]     Objective/Physical Exam Neurovascular status intact.   Incision well coapted with staples intact. No sign of infectious process noted. No dehiscence. No active bleeding noted.  May edema noted to the surgical extremity.  Overall well-healing surgical foot  Radiographic Exam RT foot 06/03/2023:  Interval removal of the orthopedic hardware to the calcaneus and first TMT.  Good alignment of the first ray with orthopedic hardware to the first MTP intact and stable.  Assessment: 1. s/p ROH RT foot w/ first MTP arthrodesis. DOS: 05/26/2023   Plan of Care:  -Patient was evaluated.  -Staples removed -Dressings changed -Continue NWB surgical foot -Return to clinic 2 weeks   Felecia Shelling, DPM [facility]  Dr. Felecia Shelling, DPM    [address] [place], [place] [address]                Office [phone]  Fax [phone]

## 2023-06-22 ENCOUNTER — Encounter: Payer: Medicare Other | Admitting: Podiatry

## 2023-06-24 ENCOUNTER — Encounter: Payer: Self-pay | Admitting: Podiatry

## 2023-06-24 ENCOUNTER — Ambulatory Visit (INDEPENDENT_AMBULATORY_CARE_PROVIDER_SITE_OTHER): Payer: Medicare Other | Admitting: Podiatry

## 2023-06-24 ENCOUNTER — Ambulatory Visit (INDEPENDENT_AMBULATORY_CARE_PROVIDER_SITE_OTHER): Payer: Medicare Other

## 2023-06-24 DIAGNOSIS — Z9889 Other specified postprocedural states: Secondary | ICD-10-CM | POA: Diagnosis not present

## 2023-06-24 NOTE — Progress Notes (Signed)
   Chief Complaint  Patient presents with   Routine Post Op    POV # 3 DOS 05/26/2023 --- REMOVAL OF HARDWARE RIGHT FOOT, POSSIBLE EXPLORATION WITH REPAIR PERONEAL TENDON RIGHT     Subjective:  Patient presents today status post removal of orthopedic hardware with first MTP implant right foot.  DOS: 05/26/2023.  Patient is to do very well.  NWB in the cam boot as instructed.  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   Arthritis    knees   PONV (postoperative nausea and vomiting)    after D&C and Cholecystectomy    Past Surgical History:  Procedure Laterality Date   BUNIONECTOMY Right    BUNIONECTOMY Right 01/22/2020   Procedure: BUNIONECTOMY LAPIDUS TYPE + AKIN RIGHT;  Surgeon: Rosetta Posner, DPM;  Location: [facility];  Service: Podiatry;  Laterality: Right;  pre op anes. pop/saph block   CHOLECYSTECTOMY     COLONOSCOPY     DILATION AND CURETTAGE OF UTERUS     FLAT FOOT CORRECTION Right 01/22/2020   Procedure: Cody Albus/MCDO;  Surgeon: Rosetta Posner, DPM;  Location: [facility];  Service: Podiatry;  Laterality: Right;   HAMMER TOE SURGERY Right 01/22/2020   Procedure: HAMMER TOE CORRECTION;  Surgeon: Rosetta Posner, DPM;  Location: [facility];  Service: Podiatry;  Laterality: Right;   TENDON REPAIR Right 10/24/2020   Procedure: FLEXOR TENDON REPAIR SECONDARY RIGHT; NONUNION REPAIR SECONDARDY RIGHT;  Surgeon: [name], DPM;  Location: [facility];  Service: Podiatry;  Laterality: Right;    Allergies  Allergen Reactions   Gluten Meal Diarrhea    Also, - Chills, "sweats", weakness   Lactose     Other reaction(s): Abdominal Pain   Lactose Intolerance (Gi) Other (See Comments)    Stomach cramps   Voltaren [Diclofenac]     Objective/Physical Exam Neurovascular status intact.  Incision nicely healed.  Mild edema noted.  Overall well-healing surgical  foot.  Toes in good rectus alignment Radiographic Exam RT foot 06/03/2023:  Interval removal of the orthopedic hardware to the calcaneus and first TMT.  Good alignment of the first ray with orthopedic hardware to the first MTP intact and stable.  Assessment: 1. s/p ROH RT foot w/ first MTP arthrodesis. DOS: 05/26/2023   Plan of Care:  -Patient was evaluated.  - Patient may begin WBAT in the cam boot over the next 4 weeks with the assistance of a walker.  Slowly wean off of the walker -Physical therapy was discussed.  Ultimately the patient declined -Compression ankle sleeve dispensed.  Patient also has compression socks.  Wear daily -Return to clinic 4 weeks follow-up x-ray and to transition the patient out of the cam boot into good supportive tennis shoes and sneakers   Felecia Shelling, DPM [facility]  Dr. Felecia Shelling, DPM    [address] [place], [place] [address]                Office [phone]  Fax [phone]

## 2023-07-09 ENCOUNTER — Emergency Department (HOSPITAL_COMMUNITY): Payer: Medicare Other

## 2023-07-09 ENCOUNTER — Encounter (HOSPITAL_COMMUNITY): Payer: Self-pay

## 2023-07-09 ENCOUNTER — Emergency Department (HOSPITAL_COMMUNITY)
Admission: EM | Admit: 2023-07-09 | Discharge: 2023-07-09 | Disposition: A | Discharge status: Home / Self Care | Payer: Medicare Other | Attending: Emergency Medicine | Admitting: Emergency Medicine

## 2023-07-09 DIAGNOSIS — I471 Supraventricular tachycardia, unspecified: Secondary | ICD-10-CM | POA: Diagnosis not present

## 2023-07-09 DIAGNOSIS — R002 Palpitations: Secondary | ICD-10-CM | POA: Diagnosis present

## 2023-07-09 HISTORY — DX: Supraventricular tachycardia, unspecified: I47.10

## 2023-07-09 LAB — BASIC METABOLIC PANEL
Anion gap: 14 (ref 5–15)
BUN: 20 mg/dL (ref 8–23)
CO2: 18 mmol/L — ABNORMAL LOW (ref 22–32)
Calcium: 8.6 mg/dL — ABNORMAL LOW (ref 8.9–10.3)
Chloride: 107 mmol/L (ref 98–111)
Creatinine, Ser: 0.95 mg/dL (ref 0.44–1.00)
GFR, Estimated: >60 mL/min (ref >60)
Glucose, Bld: 108 mg/dL — ABNORMAL HIGH (ref 70–99)
Potassium: 3.5 mmol/L (ref 3.5–5.1)
Sodium: 139 mmol/L (ref 135–145)

## 2023-07-09 LAB — CBC
HCT: 43.7 % (ref 36.0–46.0)
Hemoglobin: 14.1 g/dL (ref 12.0–15.0)
MCH: 29 pg (ref 26.0–34.0)
MCHC: 32.3 g/dL (ref 30.0–36.0)
MCV: 89.7 fL (ref 80.0–100.0)
Platelets: 176 10*3/uL (ref 150–400)
RBC: 4.87 MIL/uL (ref 3.87–5.11)
RDW: 13.7 % (ref 11.5–15.5)
WBC: 9.5 10*3/uL (ref 4.0–10.5)
nRBC: 0 % (ref 0.0–0.2)

## 2023-07-09 LAB — D-DIMER, QUANTITATIVE: D-Dimer, Quant: 0.48 ug{FEU}/mL (ref 0.00–0.50)

## 2023-07-09 LAB — TROPONIN I (HIGH SENSITIVITY): Troponin I (High Sensitivity): 4 ng/L (ref <18)

## 2023-07-09 NOTE — ED Provider Notes (Signed)
[place] EMERGENCY DEPARTMENT AT [facility] Provider Note   CSN: [id number] Arrival date & time: 07/09/23  1433     History  Chief Complaint  Patient presents with   SVT    Tammy Vang is a 67 y.o. female.  Pt is a 67 yo female with pmhx significant for palpitations and arthritis.  Pt said she was sitting at home and felt like her HR was going really fast.  She called EMS and was found to be in SVT with HR of 204.  She was given 6 mg adenosine and SVT broke.  She was also given 324 mg asa and 2 nitroglycerin tabs as well as 700 cc NS.  Pt has a little cp, but not bad.  She has seen cards for palpitations in the past, but no formal dx of SVT.  She thought it was due to voltaren and energy drinks, but she stopped those over a year ago.  She did have foot surgery about 6 weeks ago and is still in a boot.       Home Medications Prior to Admission medications   Medication Sig Start Date End Date Taking? Authorizing Provider  Biotin 1000 MCG tablet Take 1,000 mcg by mouth daily.    [provider]  Boswellia-Glucosamine-Vit D (OSTEO BI-FLEX ONE PER DAY PO) Take 1 tablet by mouth daily at 12 noon.    [provider]  Calcium Carbonate (CALCARB 600 PO) Take 600 mg by mouth daily.    [provider]  cholecalciferol (VITAMIN D) 25 MCG (1000 UNIT) tablet Take 6,000 Units by mouth daily.    [provider]  ibuprofen (ADVIL) 800 MG tablet Take 1 tablet (800 mg total) by mouth 3 (three) times daily. 05/26/23   Felecia Shelling, DPM  Omega-3 1000 MG CAPS Take 1,000 mg by mouth daily.    [provider]  oxyCODONE-acetaminophen (PERCOCET) 5-325 MG tablet Take 1 tablet by mouth every 4 (four) hours as needed for severe pain. 05/26/23   Felecia Shelling, DPM  sodium chloride (OCEAN) 0.65 % SOLN nasal spray Place 1 spray into both nostrils 2 (two) times daily.    [provider]      Allergies    Gluten meal, Lactose, Lactose  intolerance (gi), and Voltaren [diclofenac]    Review of Systems   Review of Systems  Cardiovascular:  Positive for chest pain and palpitations.  All other systems reviewed and are negative.   Physical Exam Updated Vital Signs BP 115/67   Pulse (!) 102   Temp 98 F (36.7 C) (Oral)   Resp 14   Ht 5\' 8"  (1.727 m)   Wt 98 kg   SpO2 98%   BMI 32.84 kg/m  Physical Exam Vitals and nursing note reviewed.  Constitutional:      Appearance: Normal appearance.  HENT:     Head: Normocephalic and atraumatic.     Right Ear: External ear normal.     Left Ear: External ear normal.     Nose: Nose normal.     Mouth/Throat:     Mouth: Mucous membranes are moist.     Pharynx: Oropharynx is clear.  Eyes:     Extraocular Movements: Extraocular movements intact.     Conjunctiva/sclera: Conjunctivae normal.     Pupils: Pupils are equal, round, and reactive to light.  Cardiovascular:     Rate and Rhythm: Regular rhythm. Tachycardia present.     Pulses: Normal pulses.  Heart sounds: Normal heart sounds.  Pulmonary:     Effort: Pulmonary effort is normal.     Breath sounds: Normal breath sounds.  Abdominal:     General: Abdomen is flat. Bowel sounds are normal.     Palpations: Abdomen is soft.  Musculoskeletal:        General: Normal range of motion.     Cervical back: Normal range of motion and neck supple.     Comments: R lower leg in a cam walker  Skin:    General: Skin is warm.     Capillary Refill: Capillary refill takes less than 2 seconds.  Neurological:     General: No focal deficit present.     Mental Status: She is alert and oriented to person, place, and time.  Psychiatric:        Mood and Affect: Mood normal.        Behavior: Behavior normal.     ED Results / Procedures / Treatments   Labs (all labs ordered are listed, but only abnormal results are displayed) Labs Reviewed  BASIC METABOLIC PANEL - Abnormal; Notable for the following components:      Result Value    CO2 18 (*)    Glucose, Bld 108 (*)    Calcium 8.6 (*)    All other components within normal limits  CBC  D-DIMER, QUANTITATIVE  TROPONIN I (HIGH SENSITIVITY)  TROPONIN I (HIGH SENSITIVITY)    EKG EKG Interpretation Date/Time:  Saturday July 09 2023 15:40:50 EDT Ventricular Rate:  106 PR Interval:  209 QRS Duration:  103 QT Interval:  348 QTC Calculation: 463 R Axis:   190  Text Interpretation: Sinus or ectopic atrial tachycardia Borderline prolonged PR interval Right axis deviation Low voltage, precordial leads Consider anterolateral infarct Abnormal T, consider ischemia, lateral leads Confirmed by Jacalyn Lefevre [phone]) on 07/09/2023 3:51:31 PM  EMS EKG:     Radiology DG Chest Port 1 View  Result Date: 07/09/2023 CLINICAL DATA:  Tachycardia, chest pain, back pain. EXAM: PORTABLE CHEST 1 VIEW COMPARISON:  Chest radiograph dated 06/07/2022. FINDINGS: The heart size and mediastinal contours are within normal limits. Both lungs are clear. The visualized skeletal structures are unremarkable. IMPRESSION: No active disease. Electronically Signed   By: Romona Curls M.D.   On: 07/09/2023 15:58   DG Foot Complete Left  Result Date: 07/09/2023 Please see detailed radiograph report in office note.   Procedures Procedures    Medications Ordered in ED Medications - No data to display  ED Course/ Medical Decision Making/ A&P                                 Medical Decision Making Amount and/or Complexity of Data Reviewed Labs: ordered. Radiology: ordered.   This patient presents to the ED for concern of palpitations, this involves an extensive number of treatment options, and is a complaint that carries with it a high risk of complications and morbidity.  The differential diagnosis includes svt, afib, vt   Co morbidities that complicate the patient evaluation  palpitations and arthritis   Additional history obtained:  Additional history obtained from epic  chart review External records from outside source obtained and reviewed including EMS report   Lab Tests:  I Ordered, and personally interpreted labs.  The pertinent results include:  cbc nl, bmp nl, ddimer neg, trop neg   Imaging Studies ordered:  I ordered imaging studies including  cxr  Pending at shift change   Cardiac Monitoring:  The patient was maintained on a cardiac monitor.  I personally viewed and interpreted the cardiac monitored which showed an underlying rhythm of: st   Medicines ordered and prescription drug management:   I have reviewed the patients home medicines and have made adjustments as needed   Problem List / ED Course:  SVT:  new onset.  Pt d/w Dr. Elberta Fortis (cards) who recommends outpatient f/u.  Pt is stable for d/c.  Return if worse.   Reevaluation:  After the interventions noted above, I reevaluated the patient and found that they have :improved   Social Determinants of Health:  Lives at home   Dispostion:  Discharge with outpatient f/u        Final Clinical Impression(s) / ED Diagnoses Final diagnoses:  SVT (supraventricular tachycardia)    Rx / DC Orders ED Discharge Orders     None         Jacalyn Lefevre, MD 07/09/23 1622

## 2023-07-09 NOTE — ED Triage Notes (Signed)
Sitting on recliner at home. Sudden onset of intense jaw pain, chest pain and back pain at 1230 PM today. Called 911. SVT with HR of 204. EMS gave 6mg  adenosine at 1344. HR now is 114. Continues to have chest pain. EMS gave 324mg  Aspirin and 2 SL nitroglycerin, 4mg  zofran and 700 cc NS bolus IV.  EMS VS: 118/70 114 CBG 109 95% on RA

## 2023-07-19 ENCOUNTER — Ambulatory Visit: Payer: Medicare Other | Admitting: Podiatry

## 2023-07-22 ENCOUNTER — Encounter: Payer: Self-pay | Admitting: Podiatry

## 2023-07-22 ENCOUNTER — Ambulatory Visit (INDEPENDENT_AMBULATORY_CARE_PROVIDER_SITE_OTHER): Payer: Medicare Other

## 2023-07-22 ENCOUNTER — Ambulatory Visit (INDEPENDENT_AMBULATORY_CARE_PROVIDER_SITE_OTHER): Payer: Medicare Other | Admitting: Podiatry

## 2023-07-22 VITALS — BP 128/79 | HR 88

## 2023-07-22 DIAGNOSIS — Z9889 Other specified postprocedural states: Secondary | ICD-10-CM

## 2023-07-22 DIAGNOSIS — M2031 Hallux varus (acquired), right foot: Secondary | ICD-10-CM

## 2023-07-22 NOTE — Progress Notes (Signed)
Chief Complaint  Patient presents with   Routine Post Op    DOS: 05/26/2023  RT foot w/ first MTP arthrodesis.  "I would probably give it an 8 overall.  I had one occasion where the foot swelled on top of my foot, it was like a goose egg."    Subjective:  Patient presents today status post removal of orthopedic hardware with first MTP implant right foot.  DOS: 05/26/2023.  Patient is to do very well.  So far she has had a smooth transition postoperatively.  WBAT in the cam boot as instructed.  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   Arthritis    knees   PONV (postoperative nausea and vomiting)    after D&C and Cholecystectomy   SVT (supraventricular tachycardia) (HCC)     Past Surgical History:  Procedure Laterality Date   BUNIONECTOMY Right    BUNIONECTOMY Right 01/22/2020   Procedure: BUNIONECTOMY LAPIDUS TYPE + AKIN RIGHT;  Surgeon: Rosetta Posner, DPM;  Location: [facility];  Service: Podiatry;  Laterality: Right;  pre op anes. pop/saph block   CHOLECYSTECTOMY     COLONOSCOPY     DILATION AND CURETTAGE OF UTERUS     FLAT FOOT CORRECTION Right 01/22/2020   Procedure: [place]/MCDO;  Surgeon: Rosetta Posner, DPM;  Location: [facility];  Service: Podiatry;  Laterality: Right;   HAMMER TOE SURGERY Right 01/22/2020   Procedure: HAMMER TOE CORRECTION;  Surgeon: Rosetta Posner, DPM;  Location: [facility];  Service: Podiatry;  Laterality: Right;   TENDON REPAIR Right 10/24/2020   Procedure: FLEXOR TENDON REPAIR SECONDARY RIGHT; NONUNION REPAIR SECONDARDY RIGHT;  Surgeon: [name], DPM;  Location: [facility];  Service: Podiatry;  Laterality: Right;    Allergies  Allergen Reactions   Gluten Meal Diarrhea    Also, - Chills, "sweats", weakness   Lactose     Other reaction(s): Abdominal Pain   Lactose Intolerance (Gi) Other (See Comments)    Stomach cramps   Voltaren  [Diclofenac]     Objective/Physical Exam Neurovascular status intact.  Incision nicely healed.  No edema.  Overall well-healing surgical foot.  Toes in good rectus alignment  Radiographic Exam RT foot 07/22/2023:  Stable.  Mostly unchanged.  Interval removal of the orthopedic hardware to the calcaneus and first TMT.  Good alignment of the first ray with orthopedic hardware to the first MTP intact and stable.  Assessment: 1. s/p ROH RT foot w/ first MTP arthrodesis. DOS: 05/26/2023   Plan of Care:  -Patient was evaluated.  - Overall patient is doing very well.  WBAT in the cam boot -Patient may now discontinue the cam boot.  Recommend good supportive tennis shoes and sneakers. -We had discussed physical therapy.  Ultimately the patient declined.  She has been through physical therapy with prior surgeries -Return to clinic 12 weeks final follow-up x-ray   Felecia Shelling, DPM [facility]  Dr. Felecia Shelling, DPM    [address] [place], [place] [address]                Office [phone]  Fax [phone]

## 2023-07-24 ENCOUNTER — Encounter (HOSPITAL_COMMUNITY): Payer: Self-pay | Admitting: Radiology

## 2023-07-24 ENCOUNTER — Other Ambulatory Visit: Payer: Self-pay

## 2023-07-24 ENCOUNTER — Emergency Department (HOSPITAL_COMMUNITY)
Admission: EM | Admit: 2023-07-24 | Discharge: 2023-07-24 | Disposition: A | Discharge status: Home / Self Care | Payer: Medicare Other | Attending: Emergency Medicine | Admitting: Emergency Medicine

## 2023-07-24 ENCOUNTER — Emergency Department (HOSPITAL_COMMUNITY): Payer: Medicare Other

## 2023-07-24 DIAGNOSIS — R6884 Jaw pain: Secondary | ICD-10-CM | POA: Insufficient documentation

## 2023-07-24 DIAGNOSIS — R002 Palpitations: Secondary | ICD-10-CM | POA: Insufficient documentation

## 2023-07-24 LAB — DIFFERENTIAL
Abs Immature Granulocytes: 0.02 10*3/uL (ref 0.00–0.07)
Basophils Absolute: 0.1 10*3/uL (ref 0.0–0.1)
Basophils Relative: 1 %
Eosinophils Absolute: 0.1 10*3/uL (ref 0.0–0.5)
Eosinophils Relative: 1 %
Immature Granulocytes: 0 %
Lymphocytes Relative: 15 %
Lymphs Abs: 1.3 10*3/uL (ref 0.7–4.0)
Monocytes Absolute: 0.4 10*3/uL (ref 0.1–1.0)
Monocytes Relative: 5 %
Neutro Abs: 6.7 10*3/uL (ref 1.7–7.7)
Neutrophils Relative %: 78 %

## 2023-07-24 LAB — CBC
HCT: 44 % (ref 36.0–46.0)
Hemoglobin: 14.7 g/dL (ref 12.0–15.0)
MCH: 29.6 pg (ref 26.0–34.0)
MCHC: 33.4 g/dL (ref 30.0–36.0)
MCV: 88.7 fL (ref 80.0–100.0)
Platelets: 177 10*3/uL (ref 150–400)
RBC: 4.96 MIL/uL (ref 3.87–5.11)
RDW: 13.7 % (ref 11.5–15.5)
WBC: 8.2 10*3/uL (ref 4.0–10.5)
nRBC: 0 % (ref 0.0–0.2)

## 2023-07-24 LAB — BASIC METABOLIC PANEL
Anion gap: 12 (ref 5–15)
BUN: 24 mg/dL — ABNORMAL HIGH (ref 8–23)
CO2: 21 mmol/L — ABNORMAL LOW (ref 22–32)
Calcium: 8.2 mg/dL — ABNORMAL LOW (ref 8.9–10.3)
Chloride: 108 mmol/L (ref 98–111)
Creatinine, Ser: 1.45 mg/dL — ABNORMAL HIGH (ref 0.44–1.00)
GFR, Estimated: 40 mL/min — ABNORMAL LOW (ref >60)
Glucose, Bld: 105 mg/dL — ABNORMAL HIGH (ref 70–99)
Potassium: 3.3 mmol/L — ABNORMAL LOW (ref 3.5–5.1)
Sodium: 141 mmol/L (ref 135–145)

## 2023-07-24 LAB — TROPONIN I (HIGH SENSITIVITY)
Troponin I (High Sensitivity): 6 ng/L (ref <18)
Troponin I (High Sensitivity): 5 ng/L (ref <18)

## 2023-07-24 LAB — LIPASE, BLOOD: Lipase: 40 U/L (ref 11–51)

## 2023-07-24 MED ORDER — METOPROLOL TARTRATE 50 MG PO TABS: 25.0000 mg | ORAL_TABLET | Freq: Two times a day (BID) | ORAL | 0 refills | Status: DC

## 2023-07-24 NOTE — ED Triage Notes (Signed)
Pt states that she began having jaw pain and left arm pain that started around 3:45pm toay Pt states similar episode and she was in svt at a rate of 220 Pt has been 120 for ems on arrival she is 106 Pt conscious alert and oriented Pt states she continues to have tightness in her jaw but none in her arm at this time

## 2023-07-24 NOTE — ED Provider Notes (Signed)
[place] EMERGENCY DEPARTMENT AT [facility] Provider Note   CSN: [id number] Arrival date & time: 07/24/23  1739     History Chief Complaint  Patient presents with   Jaw Pain    HPI Tammy Vang is a 67 y.o. female presenting for palpitations and jaw pain with a history of SVT Seen 2 weeks ago with confirmed SVT. Today the patients symptoms resolved prior to arrival in ED  Patient's recorded medical, surgical, social, medication list and allergies were reviewed in the Snapshot window as part of the initial history.   Review of Systems   Review of Systems  Constitutional:  Negative for chills and fever.  HENT:  Negative for ear pain and sore throat.   Eyes:  Negative for pain and visual disturbance.  Respiratory:  Negative for cough and shortness of breath.   Cardiovascular:  Positive for palpitations. Negative for chest pain.  Gastrointestinal:  Negative for abdominal pain and vomiting.  Genitourinary:  Negative for dysuria and hematuria.  Musculoskeletal:  Negative for arthralgias and back pain.  Skin:  Negative for color change and rash.  Neurological:  Negative for seizures and syncope.  All other systems reviewed and are negative.   Physical Exam Updated Vital Signs BP 118/70   Pulse 88   Temp 98.7 F (37.1 C) (Oral)   Resp 18   Ht 5\' 7"  (1.702 m)   Wt 98 kg   SpO2 99%   BMI 33.83 kg/m  Physical Exam Constitutional:      General: She is not in acute distress.    Appearance: She is not ill-appearing or toxic-appearing.  HENT:     Head: Normocephalic and atraumatic.  Eyes:     Extraocular Movements: Extraocular movements intact.     Pupils: Pupils are equal, round, and reactive to light.  Cardiovascular:     Rate and Rhythm: Normal rate.  Pulmonary:     Effort: No respiratory distress.  Abdominal:     General: Abdomen is flat.  Musculoskeletal:        General: No swelling, deformity or signs of injury.     Cervical back: Normal range of  motion. No rigidity.  Skin:    General: Skin is warm and dry.  Neurological:     General: No focal deficit present.     Mental Status: She is alert and oriented to person, place, and time.  Psychiatric:        Mood and Affect: Mood normal.      ED Course/ Medical Decision Making/ A&P    Procedures Procedures   Medications Ordered in ED Medications - No data to display  Medical Decision Making:   67 year old female presenting with recurrent palpitations and jaw pain prior attributed to SVT.  Resolved prior to arrival. EKG here showing normal sinus rhythm, lab work with a mild AKI that appears to be prerenal.  She states that she has not had anything to drink since 9 AM this morning. This is likely the etiology of her SVT episode that resolved with vagal maneuvers.  Suggested p.o. rehydration follow-up with repeat blood work with PCP within 72 hours. Additionally, concerning her recurrent SVT episodes.  I again consulted cardiology.  Given recurrent episodes within 2 weeks of each other they would like to trial beta-blockade.  25 mg twice daily metoprolol with plan to follow-up in the outpatient setting with cardiology as already scheduled with strict return precautions regarding interval worsening.  Discussed side effect profile risk benefits of  beta-blockade and patient in agreement with this medication. Clinical Impression:  1. Palpitations      Discharge   Final Clinical Impression(s) / ED Diagnoses Final diagnoses:  Palpitations    Rx / DC Orders ED Discharge Orders          Ordered    Ambulatory referral to Cardiology        07/24/23 2108    metoprolol tartrate (LOPRESSOR) 50 MG tablet  2 times daily        07/24/23 2109              Glyn Ade, MD 07/24/23 2317

## 2023-07-24 NOTE — ED Notes (Signed)
Pt requested information on foods high in potassium. Provided patient with education print out.

## 2023-08-02 ENCOUNTER — Other Ambulatory Visit: Payer: Self-pay | Admitting: Internal Medicine

## 2023-08-02 DIAGNOSIS — Z1231 Encounter for screening mammogram for malignant neoplasm of breast: Secondary | ICD-10-CM

## 2023-08-09 NOTE — Progress Notes (Unsigned)
Electrophysiology Office Note:   Date:  08/10/2023  ID:  Keaira Divelbiss, DOB 08-27-1956, MRN [id number]  Primary Cardiologist: None Electrophysiologist: Nobie Putnam, MD      History of Present Illness:   Norina Abbot is a 67 y.o. female with h/o arthritis seen today for Electrophysiology evaluation of SVT at the request of Dr. Doran Durand.    Patient developed palpitations on 07/09/2023.  She called EMS and was found to be in SVT with a heart rate of 204 bpm.  She was given 6 mg of IV adenosine and SVT terminated.  She was brought to the ED and evaluated.  Workup was unremarkable and she was discharged home with referral for outpatient follow-up.  She then presented again to the ED on 07/24/2023 for palpitations, but these had resolved by the time of her evaluation.  She was started on metoprolol. Reports fatigue since starting metoprolol. Endorses intermittent palpitations for the past year and a half, totaling 5 sustained events. She has an occasional brief flutter in the chest.  She thinks things have been better since starting the metoprolol, although on her blood pressure machine at home she does get occasional alerts for fast and even irregular heartbeats.  Review of systems complete and found to be negative unless listed in HPI.   EP Information / Studies Reviewed:    EKG is not ordered today. EKG from 07/24/23 reviewed which showed sinus tachycardia and borderline first degree AV delay.      EMS EKG 07/09/23:     Physical Exam:   VS:  BP 120/70   Pulse 69   Ht 5\' 7"  (1.702 m)   Wt 212 lb 6.4 oz (96.3 kg)   SpO2 99%   BMI 33.27 kg/m    Wt Readings from Last 3 Encounters:  08/10/23 212 lb 6.4 oz (96.3 kg)  07/24/23 216 lb (98 kg)  07/09/23 216 lb (98 kg)     GEN: Well nourished, well developed in no acute distress NECK: No JVD; No carotid bruits CARDIAC: Regular rate and rhythm. RESPIRATORY:  Clear to auscultation without rales, wheezing or rhonchi  ABDOMEN: Soft,  non-tender, non-distended EXTREMITIES:  No edema; No deformity   ASSESSMENT AND PLAN:   Naely Nieder is a 67 y.o. female with h/o arthritis seen today for Electrophysiology evaluation of SVT at the request of Dr. Doran Durand.   #SVT: She had a sustained episode of narrow complex regular tachycardia with rates of 200 bpm.  Difficult to discern P waves given the quality of ECG from EMS.  Reportedly tachycardia terminated with IV adenosine, suggesting nodal involvement, although 30 to 40% of focal atrial tachycardia can be adenosine sensitive. -We will change metoprolol to tartrate to metoprolol XL 25 mg twice daily. -Echocardiogram ordered. -Zio monitor to try and assess overall burden of SVT but also to look for atrial fibrillation as her blood pressure monitor has notified her of fast and irregular heartbeats. -We discussed antiarrhythmic drug therapy versus EP study and catheter ablation if she were to have recurrence on metoprolol.  Follow up with Dr. Jimmey Ralph  in 6-8 weeks.    Total time of encounter: 63 minutes total time of encounter, including chart review, face-to-face patient care, coordination of care and counseling regarding high complexity medical decision making.   Signed, Nobie Putnam, MD

## 2023-08-10 ENCOUNTER — Encounter: Payer: Self-pay | Admitting: Cardiology

## 2023-08-10 ENCOUNTER — Ambulatory Visit (INDEPENDENT_AMBULATORY_CARE_PROVIDER_SITE_OTHER): Payer: Medicare Other

## 2023-08-10 ENCOUNTER — Ambulatory Visit: Payer: Medicare Other | Attending: Cardiology | Admitting: Cardiology

## 2023-08-10 VITALS — BP 120/70 | HR 69 | Ht 67.0 in | Wt 212.4 lb

## 2023-08-10 DIAGNOSIS — R9431 Abnormal electrocardiogram [ECG] [EKG]: Secondary | ICD-10-CM | POA: Insufficient documentation

## 2023-08-10 DIAGNOSIS — R002 Palpitations: Secondary | ICD-10-CM | POA: Insufficient documentation

## 2023-08-10 DIAGNOSIS — I471 Supraventricular tachycardia, unspecified: Secondary | ICD-10-CM | POA: Diagnosis present

## 2023-08-10 MED ORDER — METOPROLOL SUCCINATE ER 25 MG PO TB24: 25.0000 mg | ORAL_TABLET | Freq: Two times a day (BID) | ORAL | 3 refills | Status: DC

## 2023-08-10 NOTE — Progress Notes (Unsigned)
Enrolled for [employer] to mail a ZIO XT long term holter monitor to the patients address on file.  

## 2023-08-10 NOTE — Patient Instructions (Signed)
Medication Instructions:  START Metoprolol Succinate XL 25 mg twice daily *If you need a refill on your cardiac medications before your next appointment, please call your pharmacy*   Testing/Procedures: Echocardiogram Your physician has requested that you have an echocardiogram. Echocardiography is a painless test that uses sound waves to create images of your heart. It provides your doctor with information about the size and shape of your heart and how well your heart's chambers and valves are working. This procedure takes approximately one hour. There are no restrictions for this procedure. Please do NOT wear cologne, perfume, aftershave, or lotions (deodorant is allowed). Please arrive 15 minutes prior to your appointment time.  Zio Cardiac Event Monitor Your physician has recommended that you wear an event monitor. Event monitors are medical devices that record the heart's electrical activity. Doctors most often [place] these monitors to diagnose arrhythmias. Arrhythmias are problems with the speed or rhythm of the heartbeat. The monitor is a small, portable device. You can wear one while you do your normal daily activities. This is usually used to diagnose what is causing palpitations/syncope (passing out).   Follow-Up: At [facility], you and your health needs are our priority.  As part of our continuing mission to provide you with exceptional heart care, we have created designated Provider Care Teams.  These Care Teams include your primary Cardiologist (physician) and Advanced Practice Providers (APPs -  Physician Assistants and Nurse Practitioners) who all work together to provide you with the care you need, when you need it.  We recommend signing up for the patient portal called "MyChart".  Sign up information is provided on this After Visit Summary.  MyChart is used to connect with patients for Virtual Visits (Telemedicine).  Patients are able to view lab/test results, encounter  notes, upcoming appointments, etc.  Non-urgent messages can be sent to your provider as well.   To learn more about what you can do with MyChart, go to [email or web address].    Your next appointment:   6 - 8 week(s) - schedule for after echocardiogram  Provider:   Nobie Putnam, MD    Christena Deem- Long Term Monitor Instructions  Your physician has requested you wear a ZIO patch monitor for 14 days.  This is a single patch monitor. [employer] supplies one patch monitor per enrollment. Additional stickers are not available. Please do not apply patch if you will be having a Nuclear Stress Test,  Echocardiogram, Cardiac CT, MRI, or Chest Xray during the period you would be wearing the  monitor. The patch cannot be worn during these tests. You cannot remove and re-apply the  ZIO XT patch monitor.  Your ZIO patch monitor will be mailed 3 day USPS to your address on file. It may take 3-5 days  to receive your monitor after you have been enrolled.  Once you have received your monitor, please review the enclosed instructions. Your monitor  has already been registered assigning a specific monitor serial # to you.  Billing and Patient Assistance Program Information  We have supplied [employer] with any of your insurance information on file for billing purposes. [employer] offers a sliding scale Patient Assistance Program for patients that do not have  insurance, or whose insurance does not completely cover the cost of the ZIO monitor.  You must apply for the Patient Assistance Program to qualify for this discounted rate.  To apply, please call [employer] at [phone], select option 4, select option 2, ask to apply for  Patient Assistance  Program. Meredeth Ide will ask your household income, and how many people  are in your household. They will quote your out-of-pocket cost based on that information.  [employer] will also be able to set up a 107-month, interest-free payment plan if needed.  Applying the monitor    Shave hair from upper left chest.  Hold abrader disc by orange tab. Rub abrader in 40 strokes over the upper left chest as  indicated in your monitor instructions.  Clean area with 4 enclosed alcohol pads. Let dry.  Apply patch as indicated in monitor instructions. Patch will be placed under collarbone on left  side of chest with arrow pointing upward.  Rub patch adhesive wings for 2 minutes. Remove white label marked "1". Remove the white  label marked "2". Rub patch adhesive wings for 2 additional minutes.  While looking in a mirror, press and release button in center of patch. A small green light will  flash 3-4 times. This will be your only indicator that the monitor has been turned on.  Do not shower for the first 24 hours. You may shower after the first 24 hours.  Press the button if you feel a symptom. You will hear a small click. Record Date, Time and  Symptom in the Patient Logbook.  When you are ready to remove the patch, follow instructions on the last 2 pages of Patient  Logbook. Stick patch monitor onto the last page of Patient Logbook.  Place Patient Logbook in the blue and white box. Use locking tab on box and tape box closed  securely. The blue and white box has prepaid postage on it. Please place it in the mailbox as  soon as possible. Your physician should have your test results approximately 7 days after the  monitor has been mailed back to [facility].  Call [facility] Customer Care at [phone] if you have questions regarding  your ZIO XT patch monitor. Call them immediately if you see an orange light blinking on your  monitor.  If your monitor falls off in less than 4 days, contact our Monitor department at [phone].  If your monitor becomes loose or falls off after 4 days call [employer] at [phone] for  suggestions on securing your monitor

## 2023-08-13 DIAGNOSIS — I471 Supraventricular tachycardia, unspecified: Secondary | ICD-10-CM

## 2023-09-01 ENCOUNTER — Encounter: Payer: Self-pay | Admitting: Dermatology

## 2023-09-07 ENCOUNTER — Ambulatory Visit (HOSPITAL_COMMUNITY): Payer: Medicare Other | Attending: Cardiology

## 2023-09-07 DIAGNOSIS — R9431 Abnormal electrocardiogram [ECG] [EKG]: Secondary | ICD-10-CM | POA: Insufficient documentation

## 2023-09-07 DIAGNOSIS — I471 Supraventricular tachycardia, unspecified: Secondary | ICD-10-CM | POA: Insufficient documentation

## 2023-09-07 LAB — ECHOCARDIOGRAM COMPLETE
Area-P 1/2: 4.01 cm2
S' Lateral: 2.4 cm

## 2023-09-12 ENCOUNTER — Ambulatory Visit
Admission: RE | Admit: 2023-09-12 | Discharge: 2023-09-12 | Disposition: A | Discharge status: Home / Self Care | Payer: Medicare Other | Source: Ambulatory Visit | Attending: Internal Medicine | Admitting: Internal Medicine

## 2023-09-12 DIAGNOSIS — Z1231 Encounter for screening mammogram for malignant neoplasm of breast: Secondary | ICD-10-CM | POA: Diagnosis present

## 2023-09-16 ENCOUNTER — Other Ambulatory Visit: Payer: Self-pay | Admitting: Internal Medicine

## 2023-09-16 DIAGNOSIS — R928 Other abnormal and inconclusive findings on diagnostic imaging of breast: Secondary | ICD-10-CM

## 2023-09-20 ENCOUNTER — Ambulatory Visit
Admission: RE | Admit: 2023-09-20 | Discharge: 2023-09-20 | Disposition: A | Discharge status: Home / Self Care | Payer: Medicare Other | Source: Ambulatory Visit | Attending: Internal Medicine | Admitting: Internal Medicine

## 2023-09-20 DIAGNOSIS — R928 Other abnormal and inconclusive findings on diagnostic imaging of breast: Secondary | ICD-10-CM | POA: Insufficient documentation

## 2023-09-22 ENCOUNTER — Encounter: Payer: Self-pay | Admitting: Internal Medicine

## 2023-10-07 ENCOUNTER — Other Ambulatory Visit: Payer: Self-pay

## 2023-10-07 ENCOUNTER — Emergency Department
Admission: EM | Admit: 2023-10-07 | Discharge: 2023-10-07 | Disposition: A | Discharge status: Home / Self Care | Payer: Medicare Other | Attending: Emergency Medicine | Admitting: Emergency Medicine

## 2023-10-07 DIAGNOSIS — H5711 Ocular pain, right eye: Secondary | ICD-10-CM | POA: Insufficient documentation

## 2023-10-07 MED ORDER — FLUORESCEIN SODIUM 1 MG OP STRP
1.0000 | ORAL_STRIP | Freq: Once | OPHTHALMIC | Status: AC
  Administered 2023-10-07: 1 via OPHTHALMIC
  Filled 2023-10-07: qty 1

## 2023-10-07 MED ORDER — TETRACAINE HCL 0.5 % OP SOLN
1.0000 [drp] | Freq: Once | OPHTHALMIC | Status: AC
  Administered 2023-10-07: 1 [drp] via OPHTHALMIC
  Filled 2023-10-07: qty 4

## 2023-10-07 NOTE — ED Provider Notes (Signed)
[facility] Provider Note    Event Date/Time   First MD Initiated Contact with Patient 10/07/23 0732     (approximate)   History   Foreign Body in Eye   HPI  Tammy Vang is a 67 y.o. female history of arthritis, SVT presents emergency department with right eye pain.  Patient states started suddenly this morning.  States she had grit in her eye over the last few days and when she rubbed it today felt like there is a foreign body cannot open her eye due to the pain and is having a lot of tearing.      Physical Exam   Triage Vital Signs: ED Triage Vitals [10/07/23 0729]  Encounter Vitals Group     BP (!) 142/84     Systolic BP Percentile      Diastolic BP Percentile      Pulse Rate 94     Resp 18     Temp 98.4 F (36.9 C)     Temp Source Oral     SpO2 96 %     Weight      Height      Head Circumference      Peak Flow      Pain Score (S) 8     Pain Loc      Pain Education      Exclude from Growth Chart     Most recent vital signs: Vitals:   10/07/23 0729  BP: (!) 142/84  Pulse: 94  Resp: 18  Temp: 98.4 F (36.9 C)  SpO2: 96%     General: Awake, no distress.   CV:  Good peripheral perfusion. regular rate and  rhythm Resp:  Normal effort.  Abd:  No distention.   Other:  Right eye has noted tearing, fluorescein stain does not show corneal abrasion, patient did have relief with tetracaine   ED Results / Procedures / Treatments   Labs (all labs ordered are listed, but only abnormal results are displayed) Labs Reviewed - No data to display   EKG     RADIOLOGY     PROCEDURES:   Procedures   MEDICATIONS ORDERED IN ED: Medications  tetracaine (PONTOCAINE) 0.5 % ophthalmic solution 1 drop (1 drop Right Eye Given by Other 10/07/23 0743)  fluorescein ophthalmic strip 1 strip (1 strip Right Eye Given by Other 10/07/23 0743)     IMPRESSION / MDM / ASSESSMENT AND PLAN / ED COURSE  I reviewed the triage vital signs  and the nursing notes.                              Differential diagnosis includes, but is not limited to, corneal abrasion, conjunctivitis, dacryocystitis  Patient's presentation is most consistent with acute complicated illness / injury requiring diagnostic workup.   Patient had relief with the tetracaine for just a few minutes.  Pain started to come back.  However do feel this is more topical.  However due to the continued pain I did consult Dr. Druscilla Brownie.  She is to go to his office for evaluation.  I told her that they have better tools to look at the retina etc. she has had problems with her retina in the past.  Patient is in agreement treatment plan.  Discharged stable condition.      FINAL CLINICAL IMPRESSION(S) / ED DIAGNOSES   Final diagnoses:  Acute right eye pain  Rx / DC Orders   ED Discharge Orders     None        Note:  This document was prepared using Dragon voice recognition software and may include unintentional dictation errors.    Faythe Ghee, PA-C 10/07/23 1020    Corena Herter, MD 10/07/23 [phone]

## 2023-10-07 NOTE — ED Notes (Signed)
Patient discharged to home per MD order. Patient in stable condition, and deemed medically cleared by ED provider for discharge. Discharge instructions reviewed with patient/family using "Teach Back"; verbalized understanding of medication education and administration, and information about follow-up care. Denies further concerns. ° °

## 2023-10-07 NOTE — ED Triage Notes (Signed)
Pt states she got up this morning and felt something go in her right eye, tried to wash it out but unsuccessful.

## 2023-10-20 NOTE — Progress Notes (Signed)
 Electrophysiology Office Note:   Date:  10/21/2023  ID:  Tammy Vang, DOB May 12, 1956, MRN [id number]  Primary Cardiologist: None Electrophysiologist: Fonda Kitty, MD      History of Present Illness:   Tammy Vang is a 68 y.o. female with h/o arthritis seen today for Electrophysiology evaluation of SVT at the request of Dr. Jerral. Patient developed palpitations on 07/09/2023.  She called EMS and was found to be in SVT with a heart rate of 204 bpm.  She was given 6 mg of IV adenosine and SVT terminated.  She was brought to the ED and evaluated.  Workup was unremarkable and she was discharged home with referral for outpatient follow-up.  She then presented again to the ED on 07/24/2023 for palpitations, but these had resolved by the time of her evaluation. Endorses intermittent palpitations for the past year and a half, totaling 5 sustained events.   Since our last visit, the patient reports that she has done relatively well.  She has been taking metoprolol  XL twice daily.  She does have some grogginess after taking her morning dose.  She wore a ZIO monitor for 14 days, which showed only 4 nonsustained SVT episodes with the longest lasting 16.7 seconds.  She continues to endorse having occasional fluttering in her chest that usually lasts for seconds at a time.  No new or acute complaints today aside from above.  Review of systems complete and found to be negative unless listed in HPI.   EP Information / Studies Reviewed:    EKG is not ordered today. EKG from 07/24/23 reviewed which showed sinus tachycardia and borderline first degree AV delay.      EMS EKG 07/09/23:    Echo 09/07/23:  1. Left ventricular ejection fraction, by estimation, is 55 to 60%. The  left ventricle has normal function. The left ventricle has no regional  wall motion abnormalities. Left ventricular diastolic parameters were  normal. The average left ventricular  global longitudinal strain is -18.9 %. The  global longitudinal strain is  normal.   2. Right ventricular systolic function is normal. The right ventricular  size is normal.   3. Left atrial size was mildly dilated.   4. The mitral valve is abnormal. No evidence of mitral valve  regurgitation. No evidence of mitral stenosis.   5. The aortic valve is tricuspid. There is mild calcification of the  aortic valve. Aortic valve regurgitation is not visualized. Aortic valve  sclerosis is present, with no evidence of aortic valve stenosis.   6. The inferior vena cava is normal in size with greater than 50%  respiratory variability, suggesting right atrial pressure of 3 mmHg.   Zio 08/2023: Patch Wear Time:  13 days and 23 hours (2024-11-02T09:19:18-0400 to 2024-11-16T08:07:19-0500)   HR 41 - 162, average 65 bpm. 4 nonsustained SVT (longest 16.7 seconds) and 2 nonsustained VT (longest 9 seconds). No atrial fibrillation detected. Rare supraventricular ectopy. Rare ventricular ectopy. No sustained arrhythmias. Symptom trigger episodes correspond to sinus rhythm.   Physical Exam:   VS:  BP 104/60   Pulse 82   Ht 5' 7 (1.702 m)   Wt 212 lb 3.2 oz (96.3 kg)   SpO2 96%   BMI 33.24 kg/m    Wt Readings from Last 3 Encounters:  10/21/23 212 lb 3.2 oz (96.3 kg)  08/10/23 212 lb 6.4 oz (96.3 kg)  07/24/23 216 lb (98 kg)     GEN: Well nourished, well developed in no acute distress NECK: No JVD  CARDIAC: Regular rate and rhythm. RESPIRATORY:  Clear to auscultation without rales, wheezing or rhonchi  ABDOMEN: Soft, non-tender, non-distended EXTREMITIES:  No edema; No deformity   ASSESSMENT AND PLAN:   Tammy Vang is a 68 y.o. female with h/o arthritis seen today for follow-up evaluation of SVT.  At her last visit, she was started on metoprolol  XL 25 mg twice daily.  She wore a ZIO monitor for 14 days, which showed 4 nonsustained SVT episodes, longest lasting 16.7 seconds.  She also had an echocardiogram which showed normal LV size  and function and no significant valvular disease.  #SVT: She had a sustained episode of narrow complex regular tachycardia with rates of 200 bpm.  Difficult to discern P waves given the quality of ECG from EMS.  Reportedly tachycardia terminated with IV adenosine, suggesting nodal involvement, although 30 to 40% of focal atrial tachycardia can be adenosine sensitive. #Palpitations: -I discussed with her again that catheter ablation would be an option for her.  At this time she does not want to pursue ablation.  If she were to have more sustained episodes, then I think readdressing ablation or starting antiarrhythmic drug therapy would be appropriate. -Because of her grogginess after her morning dose of metoprolol , we will change from metoprolol  XL 25 mg twice daily to 50 mg once before bed.  Her average daily heart rate on her Zio monitor was 65 beats a minute while taking a total of 50 mg daily.  If she continues to feel groggy despite this change, then we will stop metoprolol  and switch to Cardizem with close monitoring of her blood pressure.  Follow up with Dr. Kennyth  in 6 months.   Signed, Fonda Kennyth, MD

## 2023-10-21 ENCOUNTER — Ambulatory Visit: Payer: Medicare Other | Attending: Cardiology | Admitting: Cardiology

## 2023-10-21 VITALS — BP 104/60 | HR 82 | Ht 67.0 in | Wt 212.2 lb

## 2023-10-21 DIAGNOSIS — I471 Supraventricular tachycardia, unspecified: Secondary | ICD-10-CM | POA: Diagnosis present

## 2023-10-21 DIAGNOSIS — R002 Palpitations: Secondary | ICD-10-CM | POA: Diagnosis present

## 2023-10-21 MED ORDER — METOPROLOL SUCCINATE ER 50 MG PO TB24: 50.0000 mg | ORAL_TABLET | Freq: Every evening | ORAL | 3 refills | Status: DC

## 2023-10-21 NOTE — Patient Instructions (Addendum)
 Medication Instructions:  Your physician has recommended you make the following change in your medication:  1) INCREASE Toprol  XL (metoprolol  succinate) 50 mg once every evening  *If you need a refill on your cardiac medications before your next appointment, please call your pharmacy*  Follow-Up: At [facility], you and your health needs are our priority.  As part of our continuing mission to provide you with exceptional heart care, we have created designated Provider Care Teams.  These Care Teams include your primary Cardiologist (physician) and Advanced Practice Providers (APPs -  Physician Assistants and Nurse Practitioners) who all work together to provide you with the care you need, when you need it.  Your next appointment:   6 months  Provider:   You may see Fonda Kitty, MD or one of the following Advanced Practice Providers on your designated Care Team:   Charlies Arthur, PA-C Michael Andy Tillery, PA-C Suzann Riddle, NP Daphne Barrack, NP

## 2023-10-25 ENCOUNTER — Ambulatory Visit (INDEPENDENT_AMBULATORY_CARE_PROVIDER_SITE_OTHER): Payer: Medicare Other

## 2023-10-25 ENCOUNTER — Ambulatory Visit (INDEPENDENT_AMBULATORY_CARE_PROVIDER_SITE_OTHER): Payer: Medicare Other | Admitting: Podiatry

## 2023-10-25 ENCOUNTER — Encounter: Payer: Self-pay | Admitting: Podiatry

## 2023-10-25 VITALS — Ht 67.0 in | Wt 212.0 lb

## 2023-10-25 DIAGNOSIS — Z9889 Other specified postprocedural states: Secondary | ICD-10-CM | POA: Diagnosis not present

## 2023-10-25 DIAGNOSIS — M2141 Flat foot [pes planus] (acquired), right foot: Secondary | ICD-10-CM | POA: Diagnosis not present

## 2023-10-25 NOTE — Progress Notes (Signed)
   Chief Complaint  Patient presents with   Routine Post Op    follow up DOS 05/26/2023 --- REMOVAL OF HARDWARE RIGHT FOOT, POSSIBLE EXPLORATION WITH REPAIR PERONEAL TENDON RIGHT    Subjective:  Patient presents today status post removal of orthopedic hardware with first MTP implant right foot.  DOS: 05/26/2023.  Patient doing well.  She does say that she has some altered gait and she has been applying heel lifts to the left lower extremity.  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   Arthritis    knees   PONV (postoperative nausea and vomiting)    after D&C and Cholecystectomy   SVT (supraventricular tachycardia) (HCC)     Past Surgical History:  Procedure Laterality Date   BUNIONECTOMY Right    BUNIONECTOMY Right 01/22/2020   Procedure: BUNIONECTOMY LAPIDUS TYPE + AKIN RIGHT;  Surgeon: Lennie Barter, DPM;  Location: [facility];  Service: Podiatry;  Laterality: Right;  pre op anes. pop/saph block   CHOLECYSTECTOMY     COLONOSCOPY     DILATION AND CURETTAGE OF UTERUS     FLAT FOOT CORRECTION Right 01/22/2020   Procedure: Deneane Stifter/MCDO;  Surgeon: Lennie Barter, DPM;  Location: [facility];  Service: Podiatry;  Laterality: Right;   HAMMER TOE SURGERY Right 01/22/2020   Procedure: HAMMER TOE CORRECTION;  Surgeon: Lennie Barter, DPM;  Location: [facility];  Service: Podiatry;  Laterality: Right;   TENDON REPAIR Right 10/24/2020   Procedure: FLEXOR TENDON REPAIR SECONDARY RIGHT; NONUNION REPAIR SECONDARDY RIGHT;  Surgeon: Ashley Soulier, DPM;  Location: [facility];  Service: Podiatry;  Laterality: Right;    Allergies  Allergen Reactions   Gluten Meal Diarrhea    Also, - Chills, sweats, weakness   Lactose     Other reaction(s): Abdominal Pain   Lactose Intolerance (Gi) Other (See Comments)    Stomach cramps   Voltaren [Diclofenac]     Objective/Physical  Exam Neurovascular status intact.  Incision nicely healed.  No edema.  Overall well-healing surgical foot.  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  Mostly unchanged.  Interval removal of the orthopedic hardware to the calcaneus and first TMT.  Good alignment of the first ray with orthopedic hardware to the first MTP intact and stable.  Assessment: 1. s/p ROH RT foot w/ first MTP arthrodesis. DOS: 05/26/2023   Plan of Care:  -Patient was evaluated.  X-rays reviewed - Patient may now resume full activity no restrictions -Recommend good supportive tennis shoes with her custom orthotics -Return to clinic as needed   Thresa EMERSON Sar, DPM [facility]  Dr. Thresa EMERSON Sar, DPM    [address] [place], [place] [address]                Office [phone]  Fax [phone]

## 2023-10-28 ENCOUNTER — Ambulatory Visit: Payer: Medicare Other | Admitting: Podiatry

## 2023-11-18 DIAGNOSIS — M75102 Unspecified rotator cuff tear or rupture of left shoulder, not specified as traumatic: Secondary | ICD-10-CM | POA: Insufficient documentation

## 2023-12-05 ENCOUNTER — Telehealth: Payer: Self-pay | Admitting: *Deleted

## 2023-12-05 NOTE — Telephone Encounter (Signed)
   Pre-operative Risk Assessment    Patient Name: Tammy Vang  DOB: 1955/10/24 MRN: [id number]   Date of last office visit: 10/21/2023 Date of next office visit: None  Request for Surgical Clearance    Procedure:   Anterior Cervical Discectomy & Fusion  C4-7  Date of Surgery:  Clearance TBD                                 Surgeon:  Dr. Bradley Ferris Group or Practice Name:  [facility] Phone number:  [phone] Fax number:  [phone]   Type of Clearance Requested:   - Medical    Type of Anesthesia:  General    Additional requests/questions:    Signed, Emmit Pomfret   12/05/2023, 3:24 PM

## 2023-12-05 NOTE — Telephone Encounter (Signed)
 Dr. Jimmey Ralph  We have received a surgical clearance request for Ms. Ellender. They were seen recently in clinic on 10/21/2023. Can you please comment on surgical clearance for her upcoming anterior cervical discectomy and fusion. Please forward you guidance and recommendations to P CV DIV PREOP   Thank you, Robin Searing, NP

## 2023-12-08 ENCOUNTER — Telehealth: Payer: Self-pay

## 2023-12-08 NOTE — Telephone Encounter (Signed)
 Pt scheduled for televisit on 12/19/23 at 1:40. Med rec and consent on file.     Patient Consent for Virtual Visit        Tammy Vang has provided verbal consent on 12/08/2023 for a virtual visit (video or telephone).   CONSENT FOR VIRTUAL VISIT FOR:  Tammy Vang  By participating in this virtual visit I agree to the following:  I hereby voluntarily request, consent and authorize [facility] and its employed or contracted physicians, physician assistants, nurse practitioners or other licensed health care professionals (the Practitioner), to provide me with telemedicine health care services (the "Services") as deemed necessary by the treating Practitioner. I acknowledge and consent to receive the Services by the Practitioner via telemedicine. I understand that the telemedicine visit will involve communicating with the Practitioner through live audiovisual communication technology and the disclosure of certain medical information by electronic transmission. I acknowledge that I have been given the opportunity to request an in-person assessment or other available alternative prior to the telemedicine visit and am voluntarily participating in the telemedicine visit.  I understand that I have the right to withhold or withdraw my consent to the use of telemedicine in the course of my care at any time, without affecting my right to future care or treatment, and that the Practitioner or I may terminate the telemedicine visit at any time. I understand that I have the right to inspect all information obtained and/or recorded in the course of the telemedicine visit and may receive copies of available information for a reasonable fee.  I understand that some of the potential risks of receiving the Services via telemedicine include:  Delay or interruption in medical evaluation due to technological equipment failure or disruption; Information transmitted may not be sufficient (e.g. poor resolution  of images) to allow for appropriate medical decision making by the Practitioner; and/or  In rare instances, security protocols could fail, causing a breach of personal health information.  Furthermore, I acknowledge that it is my responsibility to provide information about my medical history, conditions and care that is complete and accurate to the best of my ability. I acknowledge that Practitioner's advice, recommendations, and/or decision may be based on factors not within their control, such as incomplete or inaccurate data provided by me or distortions of diagnostic images or specimens that may result from electronic transmissions. I understand that the practice of medicine is not an exact science and that Practitioner makes no warranties or guarantees regarding treatment outcomes. I acknowledge that a copy of this consent can be made available to me via my patient portal [facility] MyChart), or I can request a printed copy by calling the office of [facility].    I understand that my insurance will be billed for this visit.   I have read or had this consent read to me. I understand the contents of this consent, which adequately explains the benefits and risks of the Services being provided via telemedicine.  I have been provided ample opportunity to ask questions regarding this consent and the Services and have had my questions answered to my satisfaction. I give my informed consent for the services to be provided through the use of telemedicine in my medical care

## 2023-12-08 NOTE — Telephone Encounter (Addendum)
   Name: Tammy Vang  DOB: [birth year]  MRN: [id number]  Primary Cardiologist: None   Preoperative team, please contact this patient and set up a phone call appointment for further preoperative risk assessment. Please obtain consent and complete medication review. Thank you for your help.  I confirm that guidance regarding antiplatelet and oral anticoagulation therapy has been completed and, if necessary, noted below (none requested).   I also confirmed the patient resides in the state of [place]. As per [facility] Medical Board telemedicine laws, the patient must reside in the state in which the provider is licensed.   Joylene Grapes, NP 12/08/2023, 9:41 AM [facility]

## 2023-12-08 NOTE — Telephone Encounter (Signed)
 Pt scheduled for televisit on 12/19/23 at 1:40. Med rec and consent on file.

## 2023-12-19 ENCOUNTER — Ambulatory Visit: Payer: Medicare Other | Attending: Physician Assistant | Admitting: Physician Assistant

## 2023-12-19 DIAGNOSIS — Z01810 Encounter for preprocedural cardiovascular examination: Secondary | ICD-10-CM | POA: Insufficient documentation

## 2023-12-19 NOTE — Progress Notes (Signed)
 Virtual Visit via Telephone Note   Because of Tammy Vang co-morbid illnesses, she is at least at moderate risk for complications without adequate follow up.  This format is felt to be most appropriate for this patient at this time.  Due to technical limitations with video connection (technology), today's appointment will be conducted as an audio only telehealth visit, and Tammy Vang verbally agreed to proceed in this manner.   All issues noted in this document were discussed and addressed.  No physical exam could be performed with this format.  Evaluation Performed:  Preoperative cardiovascular risk assessment _____________   Date:  12/19/2023   Patient ID:  Tammy Vang, DOB 08-12-1956, MRN [id number] Patient Location:  Home Provider location:   Office  Primary Care Provider:  Enid Baas, MD Primary Cardiologist:  None  Chief Complaint / Patient Profile   68 y.o. y/o female with a h/o  Supraventricular Tachycardia TTE 09/07/2023: EF 55-60, no RWMA, GLS -18.9, normal RVSF, mild LAE, AV sclerosis  who is pending C4-7 discectomy and fusion with Dr. Noralyn Pick under general anesthesia and presents today for telephonic preoperative cardiovascular risk assessment.  History of Present Illness    Tammy Vang is a 68 y.o. female who presents via audio/video conferencing for a telehealth visit today.  Pt was last seen in cardiology clinic on 10/21/2023 by Dr. Jimmey Ralph.  At that time [name] was having side effects to beta-blocker therapy for SVT. She was offered RF ablation but declined. Metoprolol succinate changed from 25 twice daily to 50 at bedtime. The patient is now pending procedure as outlined above. Since her last visit, she has been having increased symptoms of palpitations described as fluttering.  Her blood pressure machine often designates that she has an erratic heart rate.  Her symptoms last for several seconds.  Over the past 3 weeks she feels that "something has  changed."  She does note fleeting left-sided sharp pain over the past week.  This is not related to exertion.  It only last seconds and there are no associated symptoms of shortness of breath or radiating symptoms.  She has not had syncope.  Blood pressures have ranged 96/66-113/67 over the last several days.   Physical Exam    Vital Signs:  [name] does not have vital signs available for review today.  Given telephonic nature of communication, physical exam is limited. AAOx3. NAD. Normal affect.  Speech and respirations are unlabored.  Accessory Clinical Findings    None  Assessment & Plan    1.  Preoperative Cardiovascular Risk Assessment: The patient's RCRI (revised cardiac risk index) is 0 giving her a 0.4% risk of perioperative major cardiac event.  She cannot achieve 4 METS of activity.  She has had more frequent palpitations recently.  Her blood pressure does not really allow for further up titration of her beta-blocker based upon a telephone visit.  Given her change in symptoms over the past 3 weeks, I recommend that she be seen in the office with an electrocardiogram to further adjust her medical therapy before proceeding with her surgery.  Her chest discomfort sounds noncardiac.  Eventhough she cannot achieve 4 METS, her RCRI risk is <1%.  Therefore, unless she develops symptoms that are possibly cardiac, she does not likely need stress testing prior to her procedure.   Time:   Today, I have spent 15 minutes with the patient with telehealth technology discussing medical history, symptoms, and management plan.    Tereso Newcomer, PA-C 12/19/2023, 1:10  PM

## 2023-12-19 NOTE — Telephone Encounter (Signed)
 Pre op APP Tereso Newcomer, [facility] stated the pt is going to need an appt in office as she has been having frequent palpitations. Pt has been scheduled appt with Jari Favre, [facility] 01/02/24 @ 9:05. Pt is agreeable to this appt as she states her husband will be out of town for a bit and wants him to  come to the appt, so to keep the 01/02/24 appt.

## 2023-12-27 IMAGING — CT CT L SPINE W/O CM
3 of 5 series · 13 of 35 positions shown, 15 images · non-contrast
Comparison: None Available.

CLINICAL DATA: Low back pain, increased fracture risk



[Series 4: l spine soft · axial · 0.30mm/px · z∈[-775,-551]mm · 6 of 147 slices shown, 8 images]
[im 23/147  soft-tissue]
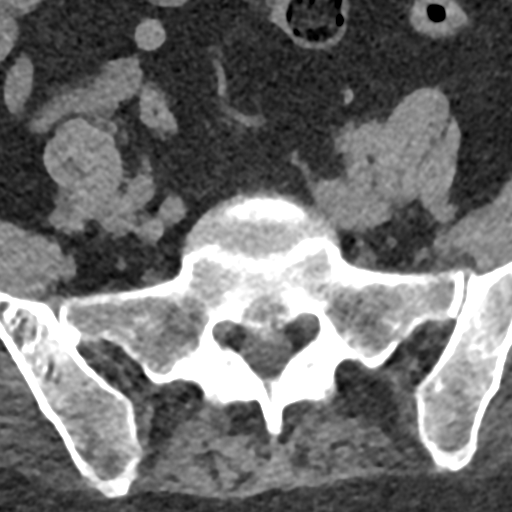
[im 23/147  bone]
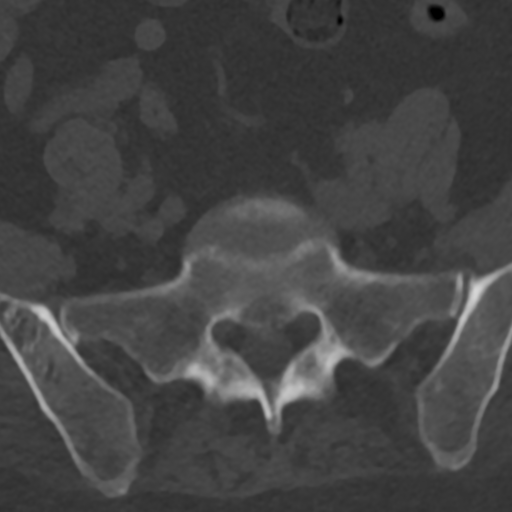
[im 45/147  bone]
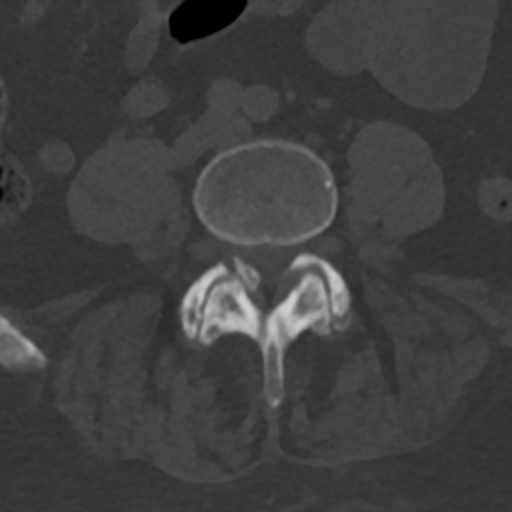
[im 68/147  bone]
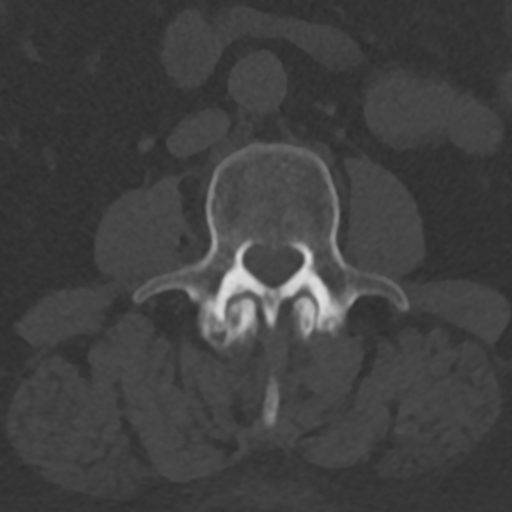
[im 90/147  bone]
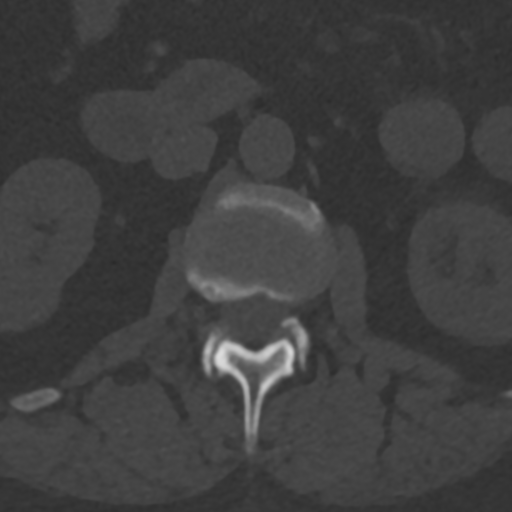
[im 113/147  soft-tissue]
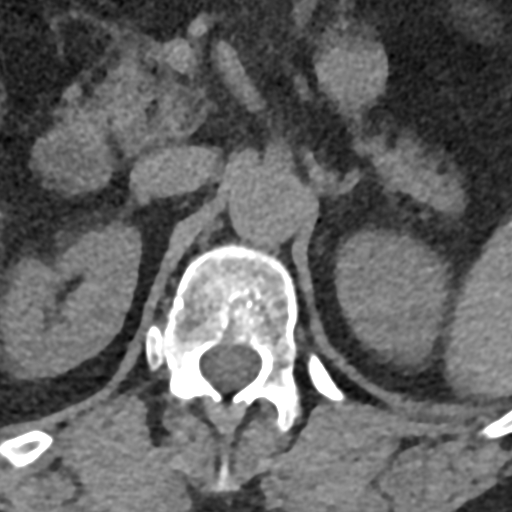
[im 113/147  bone]
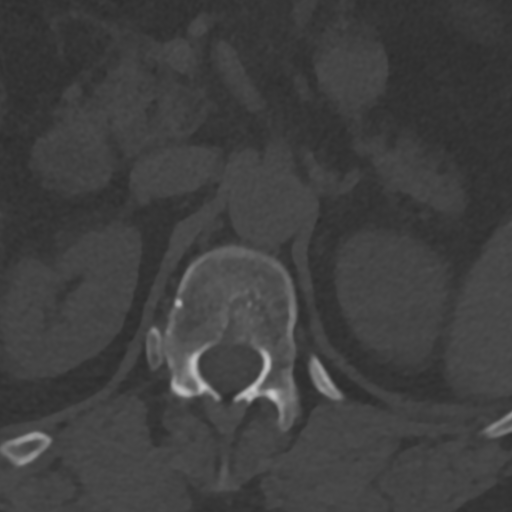
[im 135/147  bone]
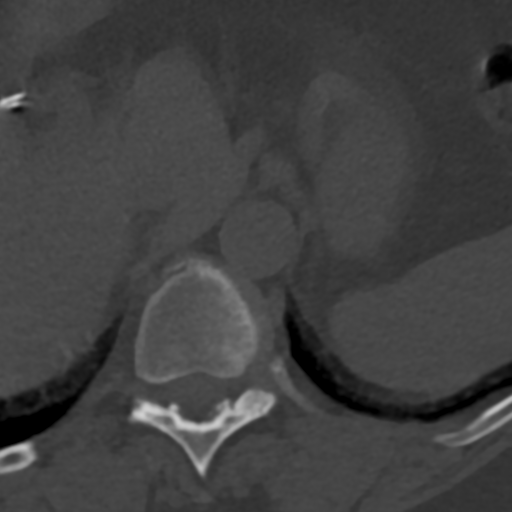

[Series 6: cor bone · coronal · 0.58mm/px · 1 of 164 slices shown]
[im 82/164  bone]
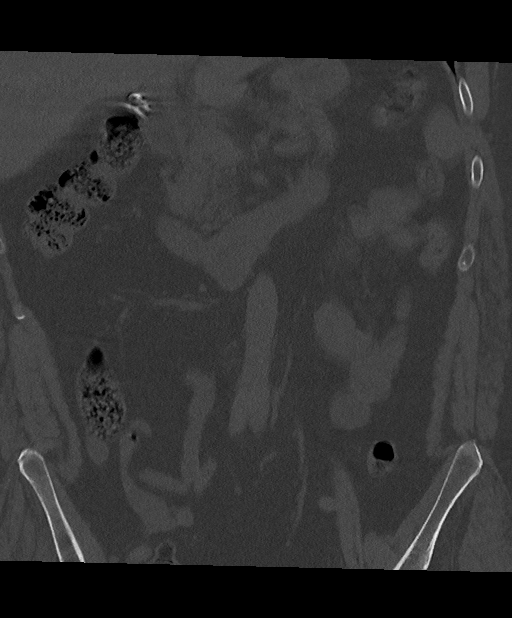

[Series 8: sag st · sagittal · 0.39mm/px · 6 of 153 slices shown]
[im 26/153  bone]
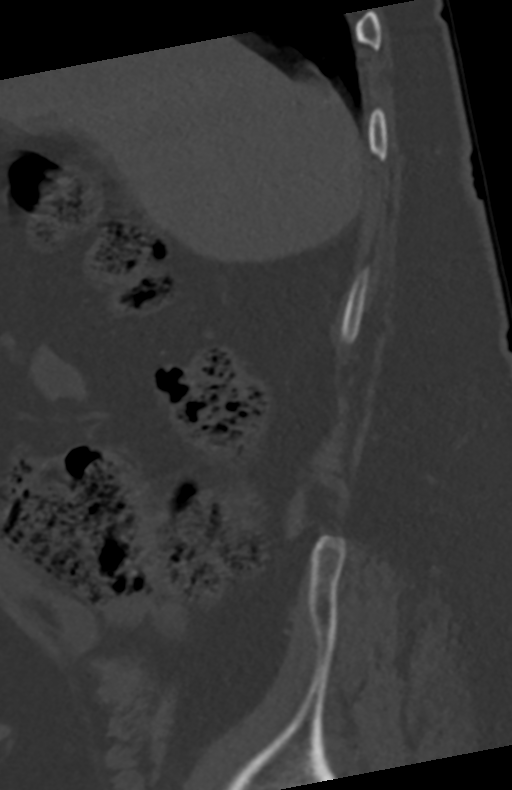
[im 51/153  bone]
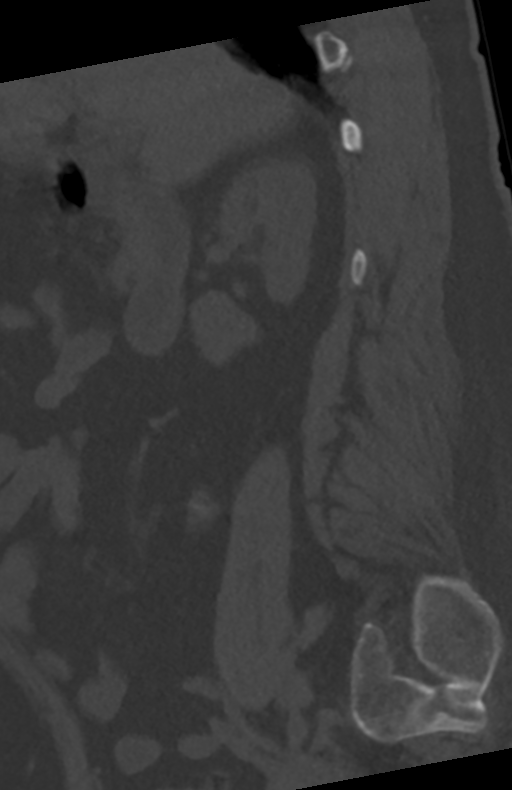
[im 77/153  bone]
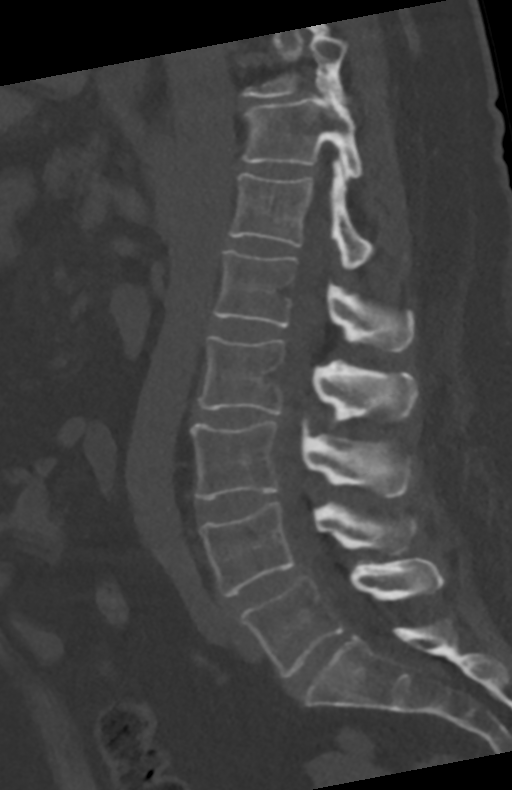
[im 102/153  bone]
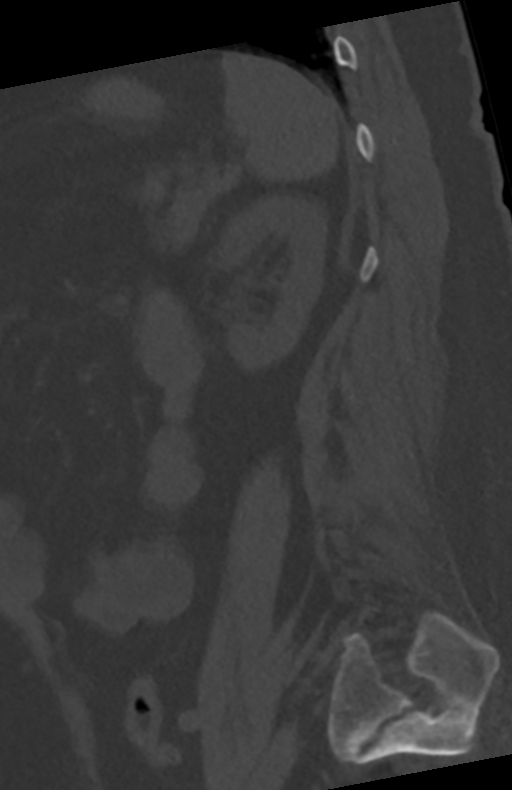
[im 119/153  soft-tissue]
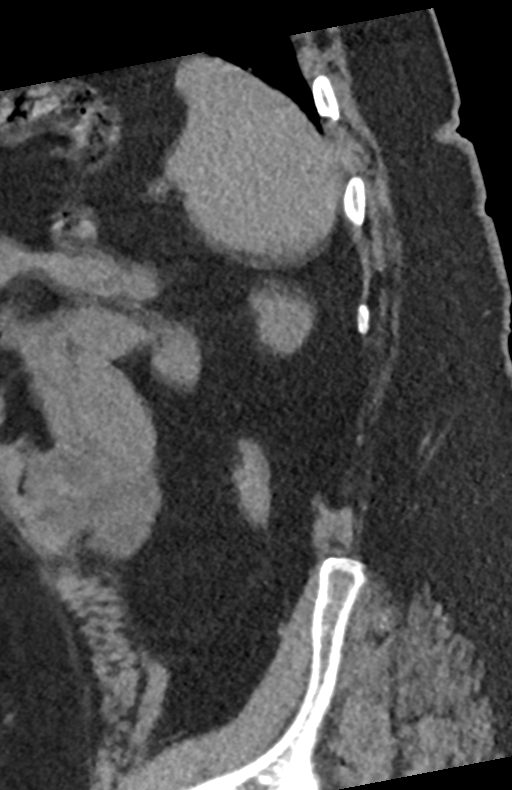
[im 127/153  bone]
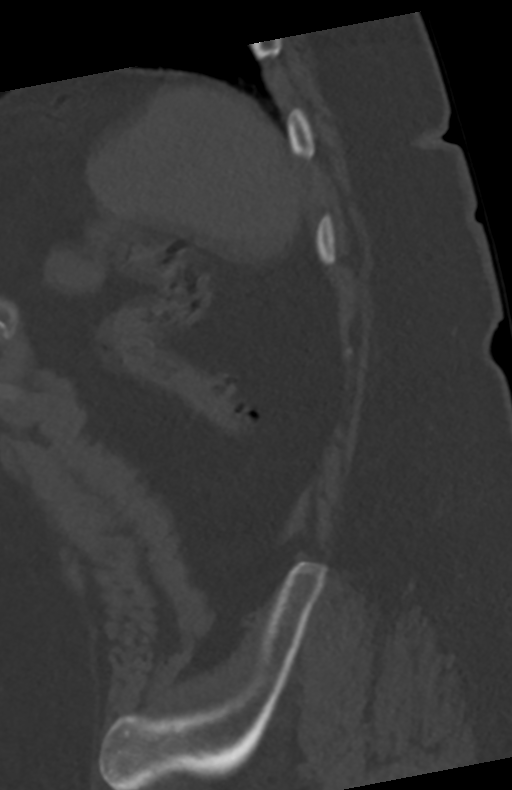

[13 of 35 positions shown; findings below may reference images not displayed]

FINDINGS: Segmentation: 5 lumbar type vertebrae.

Alignment: Mild levocurvature.  No anteroposterior listhesis.

Vertebrae: Vertebral body heights are maintained. No acute fracture.
No destructive osseous lesion.

Paraspinal and other soft tissues: Unremarkable.

Disc levels:

L1-L2:  Mild facet hypertrophy.  No stenosis.

L2-L3: Disc bulge with endplate osteophytic ridging. Facet
hypertrophy with ligamentum flavum thickening. At least moderate
canal stenosis. Narrowing of the subarticular recesses. Mild
bilateral foraminal stenosis with facet spurring abutting the
exiting nerve roots.

L3-L4: Disc bulge. Facet hypertrophy with ligamentum flavum
thickening. Moderate canal stenosis. Narrowing of the subarticular
recesses. Mild foraminal stenosis.

L4-L5: Disc bulge. Facet hypertrophy with ligamentum flavum
thickening. Moderate canal stenosis. Narrowing of the subarticular
recesses. Mild foraminal stenosis.

L5-S1: Disc bulge. Facet hypertrophy. No canal or foraminal
stenosis.
IMPRESSION: No acute abnormality. Multilevel degenerative changes. There is
facet arthropathy throughout. At least moderate canal stenosis
several levels. Outpatient MR imaging could be considered for better
evaluation.

## 2024-01-01 ENCOUNTER — Ambulatory Visit
Admission: RE | Admit: 2024-01-01 | Discharge: 2024-01-01 | Disposition: A | Discharge status: Home / Self Care | Payer: Self-pay | Source: Ambulatory Visit | Attending: Emergency Medicine | Admitting: Emergency Medicine

## 2024-01-01 VITALS — BP 132/83 | HR 98 | Temp 99.5°F | Resp 18

## 2024-01-01 DIAGNOSIS — U071 COVID-19: Secondary | ICD-10-CM | POA: Diagnosis not present

## 2024-01-01 LAB — POC COVID19/FLU A&B COMBO
Covid Antigen, POC: POSITIVE — AB
Influenza A Antigen, POC: NEGATIVE
Influenza B Antigen, POC: NEGATIVE

## 2024-01-01 NOTE — Discharge Instructions (Addendum)
 Your COVID test is positive.  Flu negative.    Take Tylenol or ibuprofen as needed for fever or discomfort.  Take plain Mucinex as needed for congestion.  Rest and keep yourself hydrated.    Follow up with your primary care provider tomorrow.  Go to the emergency department if you have worsening symptoms.

## 2024-01-01 NOTE — Progress Notes (Deleted)
 Cardiology Office Note:  .   Date:  01/01/2024  ID:  Tammy Vang, DOB July 17, 1956, MRN [id number] PCP: Enid Baas, MD  [facility] Providers Cardiologist:  None Electrophysiologist:  Nobie Putnam, MD {    History of Present Illness: Tammy Vang   Tammy Vang is a 68 y.o. female with history of arthritis recently seen for EP evaluation regarding SVT.  She now presents for follow-up and clearance.  Patient developed palpitations 07/09/2023 and called EMS was found to be in SVT with a heart rate of 204.  Given 6 mg of IV adenosine and SVT terminated.  Was brought to the ED evaluated.  Workup was unremarkable and she was discharged home with referral for outpatient follow-up.  Presented again to the ED on 07/24/2023 for palpitations but this resolved by the time of her evaluation.  Endorsed intermittent palpitations for the last year totaling 5 events.  Taking metoprolol XL twice daily.  Unfortunately does have some grogginess after her morning dose.  ZIO monitor for 14 days which only showed 4 nonsustained SVT episodes with the longest lasting 16.7 seconds.  She did continue to have occasional fluttering in her chest which usually last for seconds at a time.  No new or acute complaints today aside from above.  Today, she***  ROS: Pertinent ROS in HPI  Studies Reviewed: .       Echo 09/07/2023 IMPRESSIONS     1. Left ventricular ejection fraction, by estimation, is 55 to 60%. The  left ventricle has normal function. The left ventricle has no regional  wall motion abnormalities. Left ventricular diastolic parameters were  normal. The average left ventricular  global longitudinal strain is -18.9 %. The global longitudinal strain is  normal.   2. Right ventricular systolic function is normal. The right ventricular  size is normal.   3. Left atrial size was mildly dilated.   4. The mitral valve is abnormal. No evidence of mitral valve  regurgitation. No evidence of mitral  stenosis.   5. The aortic valve is tricuspid. There is mild calcification of the  aortic valve. Aortic valve regurgitation is not visualized. Aortic valve  sclerosis is present, with no evidence of aortic valve stenosis.   6. The inferior vena cava is normal in size with greater than 50%  respiratory variability, suggesting right atrial pressure of 3 mmHg.   FINDINGS   Left Ventricle: Left ventricular ejection fraction, by estimation, is 55  to 60%. The left ventricle has normal function. The left ventricle has no  regional wall motion abnormalities. The average left ventricular global  longitudinal strain is -18.9 %.  The global longitudinal strain is normal. The left ventricular internal  cavity size was normal in size. There is no left ventricular hypertrophy.  Left ventricular diastolic parameters were normal.   Right Ventricle: The right ventricular size is normal. No increase in  right ventricular wall thickness. Right ventricular systolic function is  normal.   Left Atrium: Left atrial size was mildly dilated.   Right Atrium: Right atrial size was normal in size.   Pericardium: There is no evidence of pericardial effusion.   Mitral Valve: The mitral valve is abnormal. There is mild thickening of  the mitral valve leaflet(s). There is mild calcification of the mitral  valve leaflet(s). No evidence of mitral valve regurgitation. No evidence  of mitral valve stenosis.   Tricuspid Valve: The tricuspid valve is normal in structure. Tricuspid  valve regurgitation is trivial. No evidence of tricuspid stenosis.  Aortic Valve: The aortic valve is tricuspid. There is mild calcification  of the aortic valve. Aortic valve regurgitation is not visualized. Aortic  valve sclerosis is present, with no evidence of aortic valve stenosis.   Pulmonic Valve: The pulmonic valve was normal in structure. Pulmonic valve  regurgitation is mild. No evidence of pulmonic stenosis.   Aorta: The  aortic root is normal in size and structure.   Venous: The inferior vena cava is normal in size with greater than 50%  respiratory variability, suggesting right atrial pressure of 3 mmHg.   IAS/Shunts: The interatrial septum was not well visualized.   Risk Assessment/Calculations:   {Does this patient have ATRIAL FIBRILLATION?[phone]}         Physical Exam:   VS:  There were no vitals taken for this visit.   Wt Readings from Last 3 Encounters:  10/25/23 212 lb (96.2 kg)  10/21/23 212 lb 3.2 oz (96.3 kg)  08/10/23 212 lb 6.4 oz (96.3 kg)    GEN: Well nourished, well developed in no acute distress NECK: No JVD; No carotid bruits CARDIAC: ***RRR, no murmurs, rubs, gallops RESPIRATORY:  Clear to auscultation without rales, wheezing or rhonchi  ABDOMEN: Soft, non-tender, non-distended EXTREMITIES:  No edema; No deformity   ASSESSMENT AND PLAN: .   Preop clearance {Click Here to Calculate RCRI      :161096045}  { Click Here to Calculate DASI      :409811914} {Select to add RCRI Risk (<1%=LOW; >/=1%=HIGH) (Optional):21036017}  {Select if HIGH (RCRI >/=1%) Risk (Optional):21036030} Recommendations: {2014 ACC/AHA Perioperative Guidelines  :21036001} Antiplatelet and/or Anticoagulation Recommendations: {Antiplatelet Recommendations                  :21036016} {Anticoagulation Recommendations           :78295621}  SVT Palpitations    {Are you ordering a CV Procedure (e.g. stress test, cath, DCCV, TEE, etc)?   Press F2        :308657846}  Dispo: ***  Signed, Sharlene Dory, PA-C

## 2024-01-01 NOTE — ED Provider Notes (Signed)
 Tammy Vang    CSN: [id number] Arrival date & time: 01/01/24  4098      History   Chief Complaint Chief Complaint  Patient presents with   Sore Throat    Husband tested positive  by your facility today for covid - Entered by patient    HPI Tammy Vang is a 68 y.o. female.  Patient presents with 1 week history of runny nose and sneezing.  She developed sore throat, headache, sinus pressure, cough, fatigue 2 days ago after her husband returned from a trip.  She has been treating her symptoms with nasal saline spray.  No fever or shortness of breath.  No vomiting or diarrhea.  Patient's husband test positive for COVID yesterday.    The history is provided by the patient and medical records.    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 pop/saph block   CHOLECYSTECTOMY     COLONOSCOPY     DILATION AND CURETTAGE OF UTERUS     FLAT FOOT CORRECTION Right 01/22/2020   Procedure: EVANS/MCDO;  Surgeon: Rosetta Posner, DPM;  Location: [facility];  Service: Podiatry;  Laterality: Right;   HAMMER TOE SURGERY  Right 01/22/2020   Procedure: HAMMER TOE CORRECTION;  Surgeon: Rosetta Posner, DPM;  Location: [facility];  Service: Podiatry;  Laterality: Right;   TENDON REPAIR Right 10/24/2020   Procedure: FLEXOR TENDON REPAIR SECONDARY RIGHT; NONUNION REPAIR SECONDARDY RIGHT;  Surgeon: [name], DPM;  Location: [facility];  Service: Podiatry;  Laterality: Right;    OB History   No obstetric history on file.      Home Medications    Prior to Admission medications   Medication Sig Start Date End Date Taking? Authorizing Provider  metoprolol succinate (TOPROL-XL) 50 MG 24 hr tablet Take 1 tablet (50 mg total) by mouth every evening. Take with or immediately following a meal. 10/21/23   Nobie Putnam, MD  sodium chloride (OCEAN) 0.65 % SOLN nasal spray Place 1 spray into both nostrils 2 (two) times daily.    [provider]    Family History Family History  Problem Relation Age of Onset   Breast cancer Neg Hx     Social History Social History   Tobacco Use   Smoking status: Never   Smokeless tobacco: Never  Vaping Use   Vaping status: Never Used  Substance Use Topics   Alcohol use: Not Currently   Drug use: Never     Allergies   Meloxicam, Gluten meal, Lactose, Lactose intolerance (gi), and Voltaren [diclofenac]   Review of Systems Review of Systems  Constitutional:  Negative for chills and fever.  HENT:  Positive for congestion, postnasal drip, rhinorrhea, sinus pressure, sneezing and sore throat. Negative for ear pain.   Respiratory:  Positive for cough. Negative for shortness of breath.   Gastrointestinal:  Negative for diarrhea and vomiting.     Physical Exam Triage Vital Signs ED Triage Vitals  Encounter Vitals Group     BP      Systolic BP Percentile      Diastolic BP Percentile      Pulse      Resp      Temp      Temp src      SpO2      Weight      Height      Head Circumference      Peak Flow      Pain Score      Pain Loc      Pain  Education      Exclude from Growth Chart    No data found.  Updated Vital Signs BP 132/83   Pulse 98   Temp 99.5 F (37.5 C)   Resp 18   SpO2 96%   Visual Acuity Right Eye Distance:   Left Eye Distance:   Bilateral Distance:    Right Eye Near:   Left Eye Near:    Bilateral Near:     Physical Exam Constitutional:      General: She is not in acute distress. HENT:     Right Ear: Tympanic membrane normal.     Left Ear: Tympanic membrane normal.     Nose: Rhinorrhea present.     Mouth/Throat:     Mouth: Mucous membranes are moist.     Pharynx: Oropharynx is clear.     Comments: PND Cardiovascular:     Rate and Rhythm: Normal rate and regular rhythm.     Heart sounds: Normal heart sounds.  Pulmonary:     Effort: Pulmonary effort is normal. No respiratory distress.     Breath sounds: Normal breath sounds.  Neurological:     Mental Status: She is alert.      UC Treatments / Results  Labs (all labs ordered are listed, but only abnormal results are displayed) Labs Reviewed  POC COVID19/FLU A&B COMBO - Abnormal; Notable for the following components:      Result Value   Covid Antigen, POC Positive (*)    All other components within normal limits    EKG   Radiology No results found.  Procedures Procedures (including critical care time)  Medications Ordered in UC Medications - No data to display  Initial Impression / Assessment and Plan / UC Course  I have reviewed the triage vital signs and the nursing notes.  Pertinent labs & imaging results that were available during my care of the patient were reviewed by me and considered in my medical decision making (see chart for details).    COVID-19.  Rapid COVID positive.  Flu negative.  Patient has had some of her symptoms for a week but most symptoms started 2 days ago after her husband returned from a trip.  He tested positive for COVID yesterday.  Patient declines treatment with Paxlovid.  Discussed  symptomatic management including Tylenol or ibuprofen, plain Mucinex, rest, hydration.  Instructed her to follow-up with her PCP tomorrow.  ED precautions given.  Education provided on COVID.  She agrees to plan of care.  Final Clinical Impressions(s) / UC Diagnoses   Final  diagnoses:  COVID-19     Discharge Instructions      Your COVID test is positive.  Flu negative.    Take Tylenol or ibuprofen as needed for fever or discomfort.  Take plain Mucinex as needed for congestion.  Rest and keep yourself hydrated.    Follow up with your primary care provider tomorrow.  Go to the emergency department if you have worsening symptoms.            ED Prescriptions   None    PDMP not reviewed this encounter.   Mickie Bail, NP 01/01/24 [phone]

## 2024-01-01 NOTE — ED Triage Notes (Signed)
 Patient to Urgent Care with complaints of sore throat/ productive cough/ nasal congestion/ ear pain/ headaches/ fatigue.  Reports symptoms started approx one week ago. Using saline nasal spray.   Husband Covid positive.

## 2024-01-02 ENCOUNTER — Ambulatory Visit: Admitting: Physician Assistant

## 2024-01-02 DIAGNOSIS — I471 Supraventricular tachycardia, unspecified: Secondary | ICD-10-CM

## 2024-01-02 DIAGNOSIS — R002 Palpitations: Secondary | ICD-10-CM

## 2024-01-02 DIAGNOSIS — Z01810 Encounter for preprocedural cardiovascular examination: Secondary | ICD-10-CM

## 2024-01-12 DIAGNOSIS — I471 Supraventricular tachycardia, unspecified: Secondary | ICD-10-CM | POA: Insufficient documentation

## 2024-01-12 NOTE — Progress Notes (Signed)
 "     Cardiology Office Note:    Date:  01/13/2024  ID:  Tammy Vang, DOB 1955-11-25, MRN [id number] PCP: Sherial Bail, MD  [facility] Providers Cardiologist:  None Electrophysiologist:  Fonda Kitty, MD       Patient Profile:      Supraventricular Tachycardia SVT w HR 204 in 06/2023 >> EMS >> Adenosine 6 x 1 TTE 09/07/2023: EF 55-60, no RWMA, GLS -18.9, normal RVSF, mild LAE, AV sclerosis EP eval 10/2023 (Dr. Kitty) - RF ablation offered but pt declined at that time         Discussed the use of AI scribe software for clinical note transcription with the patient, who gave verbal consent to proceed.  History of Present Illness Tammy Vang is a 68 y.o. female who returns for surgical clearance. She needs C4-7 discectomy and fusion under gen anesthesia. She was contacted by phone for a virtual visit on 12/19/23 for surgical clearance. However, she noted worsening palpitations and an in office appt was arranged. Shortly after that she was seen in urgent care and dx with COVID-19.   She is here with her husband. She had one episode of rapid palpitations with HR 150s prior to being dx with COVID. She used Vagal maneuvers to resolve it. She has not had any further episodes. She engages in physical activity by riding a QB bike for an hour daily but is limited in walking due to previous R foot and ankle surgeries. No chest discomfort, pressure, or shortness of breath during these activities. She experiences swelling in her right ankle. She reports no issues with breathing while lying flat. Her blood pressure typically ranges around 108/76, and she has not experienced dizziness or lightheadedness at this level.   ROS-See HPI    Studies Reviewed:   EKG Interpretation Date/Time:  Friday January 13 2024 11:15:49 EDT Ventricular Rate:  77 PR Interval:  214 QRS Duration:  78 QT Interval:  390 QTC Calculation: 441 R Axis:   -19  Text Interpretation: Sinus rhythm with 1st degree A-V  block Low voltage QRS Cannot rule out Anterior infarct , age undetermined No significant change since last tracing Confirmed by Lelon Hamilton [phone]) on 01/13/2024 11:37:38 AM    Results    Risk Assessment/Calculations:             Physical Exam:   VS:  BP 104/64   Pulse 80   Ht 5' 7 (1.702 m)   Wt 217 lb (98.4 kg)   SpO2 98%   BMI 33.99 kg/m    Wt Readings from Last 3 Encounters:  01/13/24 217 lb (98.4 kg)  10/25/23 212 lb (96.2 kg)  10/21/23 212 lb 3.2 oz (96.3 kg)    Constitutional:      Appearance: Healthy appearance. Not in distress.  Neck:     Vascular: JVD normal.  Pulmonary:     Breath sounds: Normal breath sounds. No wheezing. No rales.  Cardiovascular:     Normal rate. Regular rhythm.     Murmurs: There is no murmur.  Edema:    Peripheral edema absent.  Abdominal:     Palpations: Abdomen is soft.        Assessment and Plan:   Assessment & Plan Preoperative cardiovascular examination Ms. Garside's perioperative risk of a major cardiac event is 0.4% according to the Revised Cardiac Risk Index (RCRI).  Therefore, she is at low risk for perioperative complications.    Recommendations: According to ACC/AHA guidelines, no further  cardiovascular testing needed.  The patient may proceed to surgery at acceptable risk.   SVT (supraventricular tachycardia) (HCC) She had recent episodes of palpitations and elevated heart rate which were likely exacerbated by recent COVID-19 infection. No episodes since recovery. EKG today shows no changes.  - Continue metoprolol  succinate 50 mg oral at bedtime - She can take an extra half tablet of metoprolol  if SVT episodes occur and vagal maneuvers are ineffective - Avoid stimulants such as caffeine and decongestants - Follow up with EP as planned in July/August.      Dispo:  Return in about 3 months (around 04/13/2024) for EP F/U HAS A RECALL IN AND WANTS TO GET IT SCHEDULED.  Signed, Glendia Ferrier, PA-C   "

## 2024-01-13 ENCOUNTER — Encounter: Payer: Self-pay | Admitting: Physician Assistant

## 2024-01-13 ENCOUNTER — Ambulatory Visit: Attending: Physician Assistant | Admitting: Physician Assistant

## 2024-01-13 VITALS — BP 104/64 | HR 80 | Ht 67.0 in | Wt 217.0 lb

## 2024-01-13 DIAGNOSIS — Z01810 Encounter for preprocedural cardiovascular examination: Secondary | ICD-10-CM | POA: Insufficient documentation

## 2024-01-13 DIAGNOSIS — I471 Supraventricular tachycardia, unspecified: Secondary | ICD-10-CM | POA: Insufficient documentation

## 2024-01-13 NOTE — Assessment & Plan Note (Signed)
 She had recent episodes of palpitations and elevated heart rate which were likely exacerbated by recent COVID-19 infection. No episodes since recovery. EKG today shows no changes.  - Continue metoprolol succinate 50 mg oral at bedtime - She can take an extra half tablet of metoprolol if SVT episodes occur and vagal maneuvers are ineffective - Avoid stimulants such as caffeine and decongestants - Follow up with EP as planned in July/August.

## 2024-01-13 NOTE — Patient Instructions (Signed)
 Medication Instructions:  Your physician recommends that you continue on your current medications as directed. Please refer to the Current Medication list given to you today.  *If you need a refill on your cardiac medications before your next appointment, please call your pharmacy*  Lab Work: None ordered  If you have labs (blood work) drawn today and your tests are completely normal, you will receive your results only by: MyChart Message (if you have MyChart) OR A paper copy in the mail If you have any lab test that is abnormal or we need to change your treatment, we will call you to review the results.  Testing/Procedures: None ordered  Follow-Up: At [facility], you and your health needs are our priority.  As part of our continuing mission to provide you with exceptional heart care, our providers are all part of one team.  This team includes your primary Cardiologist (physician) and Advanced Practice Providers or APPs (Physician Assistants and Nurse Practitioners) who all work together to provide you with the care you need, when you need it.  Your next appointment:   3 month(s)  Provider:   You will see one of the following Advanced Practice Providers on your designated Care Team:   Francis Dowse, Charlott Holler "Mardelle Matte" [place], [place] Sherie Don, NP Canary Brim, NP      We recommend signing up for the patient portal called "MyChart".  Sign up information is provided on this After Visit Summary.  MyChart is used to connect with patients for Virtual Visits (Telemedicine).  Patients are able to view lab/test results, encounter notes, upcoming appointments, etc.  Non-urgent messages can be sent to your provider as well.   To learn more about what you can do with MyChart, go to [email or web address].   Other Instructions       1st Floor: - Lobby - Registration  - Pharmacy  - Lab - Cafe  2nd Floor: - PV Lab - Diagnostic Testing (echo, CT, nuclear med)  3rd  Floor: - Vacant  4th Floor: - TCTS (cardiothoracic surgery) - AFib Clinic - Structural Heart Clinic - Vascular Surgery  - Vascular Ultrasound  5th Floor: - HeartCare Cardiology (general and EP) - Clinical Pharmacy for coumadin, hypertension, lipid, weight-loss medications, and med management appointments    Valet parking services will be available as well.

## 2024-02-15 ENCOUNTER — Emergency Department

## 2024-02-15 ENCOUNTER — Observation Stay
Admission: EM | Admit: 2024-02-15 | Discharge: 2024-02-16 | Disposition: A | Discharge status: Home / Self Care | Attending: Obstetrics and Gynecology | Admitting: Obstetrics and Gynecology

## 2024-02-15 ENCOUNTER — Other Ambulatory Visit: Payer: Self-pay

## 2024-02-15 ENCOUNTER — Observation Stay

## 2024-02-15 DIAGNOSIS — M5412 Radiculopathy, cervical region: Secondary | ICD-10-CM

## 2024-02-15 DIAGNOSIS — M2140 Flat foot [pes planus] (acquired), unspecified foot: Secondary | ICD-10-CM | POA: Diagnosis not present

## 2024-02-15 DIAGNOSIS — I517 Cardiomegaly: Secondary | ICD-10-CM | POA: Diagnosis not present

## 2024-02-15 DIAGNOSIS — M7989 Other specified soft tissue disorders: Secondary | ICD-10-CM | POA: Diagnosis present

## 2024-02-15 DIAGNOSIS — I824Y1 Acute embolism and thrombosis of unspecified deep veins of right proximal lower extremity: Secondary | ICD-10-CM

## 2024-02-15 DIAGNOSIS — I82409 Acute embolism and thrombosis of unspecified deep veins of unspecified lower extremity: Secondary | ICD-10-CM | POA: Diagnosis present

## 2024-02-15 DIAGNOSIS — I82411 Acute embolism and thrombosis of right femoral vein: Secondary | ICD-10-CM | POA: Diagnosis not present

## 2024-02-15 DIAGNOSIS — Z79899 Other long term (current) drug therapy: Secondary | ICD-10-CM | POA: Diagnosis not present

## 2024-02-15 DIAGNOSIS — I471 Supraventricular tachycardia, unspecified: Secondary | ICD-10-CM | POA: Diagnosis not present

## 2024-02-15 DIAGNOSIS — I824Y9 Acute embolism and thrombosis of unspecified deep veins of unspecified proximal lower extremity: Secondary | ICD-10-CM | POA: Diagnosis not present

## 2024-02-15 DIAGNOSIS — R600 Localized edema: Principal | ICD-10-CM

## 2024-02-15 DIAGNOSIS — M542 Cervicalgia: Secondary | ICD-10-CM | POA: Insufficient documentation

## 2024-02-15 DIAGNOSIS — Z9889 Other specified postprocedural states: Secondary | ICD-10-CM | POA: Diagnosis not present

## 2024-02-15 DIAGNOSIS — Z7901 Long term (current) use of anticoagulants: Secondary | ICD-10-CM | POA: Diagnosis not present

## 2024-02-15 LAB — URINALYSIS, ROUTINE W REFLEX MICROSCOPIC
Bilirubin Urine: NEGATIVE
Glucose, UA: NEGATIVE mg/dL
Hgb urine dipstick: NEGATIVE
Ketones, ur: 20 mg/dL — AB
Leukocytes,Ua: NEGATIVE
Nitrite: NEGATIVE
Protein, ur: NEGATIVE mg/dL
Specific Gravity, Urine: 1.019 (ref 1.005–1.030)
pH: 6 (ref 5.0–8.0)

## 2024-02-15 LAB — COMPREHENSIVE METABOLIC PANEL WITH GFR
ALT: 14 U/L (ref 0–44)
AST: 20 U/L (ref 15–41)
Albumin: 4 g/dL (ref 3.5–5.0)
Alkaline Phosphatase: 57 U/L (ref 38–126)
Anion gap: 8 (ref 5–15)
BUN: 12 mg/dL (ref 8–23)
CO2: 23 mmol/L (ref 22–32)
Calcium: 9.1 mg/dL (ref 8.9–10.3)
Chloride: 106 mmol/L (ref 98–111)
Creatinine, Ser: 0.79 mg/dL (ref 0.44–1.00)
GFR, Estimated: >60 mL/min (ref >60)
Glucose, Bld: 104 mg/dL — ABNORMAL HIGH (ref 70–99)
Potassium: 4.3 mmol/L (ref 3.5–5.1)
Sodium: 137 mmol/L (ref 135–145)
Total Bilirubin: 0.6 mg/dL (ref 0.0–1.2)
Total Protein: 6.9 g/dL (ref 6.5–8.1)

## 2024-02-15 LAB — CBC WITH DIFFERENTIAL/PLATELET
Abs Immature Granulocytes: 0.02 10*3/uL (ref 0.00–0.07)
Basophils Absolute: 0.1 10*3/uL (ref 0.0–0.1)
Basophils Relative: 1 %
Eosinophils Absolute: 0.2 10*3/uL (ref 0.0–0.5)
Eosinophils Relative: 4 %
HCT: 42.6 % (ref 36.0–46.0)
Hemoglobin: 14.2 g/dL (ref 12.0–15.0)
Immature Granulocytes: 0 %
Lymphocytes Relative: 19 %
Lymphs Abs: 1.1 10*3/uL (ref 0.7–4.0)
MCH: 30.1 pg (ref 26.0–34.0)
MCHC: 33.3 g/dL (ref 30.0–36.0)
MCV: 90.3 fL (ref 80.0–100.0)
Monocytes Absolute: 0.3 10*3/uL (ref 0.1–1.0)
Monocytes Relative: 5 %
Neutro Abs: 4.2 10*3/uL (ref 1.7–7.7)
Neutrophils Relative %: 71 %
Platelets: 179 10*3/uL (ref 150–400)
RBC: 4.72 MIL/uL (ref 3.87–5.11)
RDW: 13.1 % (ref 11.5–15.5)
WBC: 5.9 10*3/uL (ref 4.0–10.5)
nRBC: 0 % (ref 0.0–0.2)

## 2024-02-15 LAB — TROPONIN I (HIGH SENSITIVITY): Troponin I (High Sensitivity): <2 ng/L (ref <18)

## 2024-02-15 LAB — BRAIN NATRIURETIC PEPTIDE: B Natriuretic Peptide: 3.5 pg/mL (ref 0.0–100.0)

## 2024-02-15 MED ORDER — DOCUSATE SODIUM 100 MG PO CAPS: 100.0000 mg | ORAL_CAPSULE | Freq: Two times a day (BID) | ORAL | Status: DC

## 2024-02-15 MED ORDER — ONDANSETRON HCL 4 MG PO TABS: 4.0000 mg | ORAL_TABLET | Freq: Four times a day (QID) | ORAL | Status: DC | PRN

## 2024-02-15 MED ORDER — METOPROLOL SUCCINATE ER 50 MG PO TB24
50.0000 mg | ORAL_TABLET | Freq: Every evening | ORAL | Status: DC
  Administered 2024-02-15: 50 mg via ORAL
  Filled 2024-02-15: qty 1

## 2024-02-15 MED ORDER — HYDROCODONE-ACETAMINOPHEN 5-325 MG PO TABS
1.0000 | ORAL_TABLET | Freq: Once | ORAL | Status: AC
  Administered 2024-02-15: 1 via ORAL
  Filled 2024-02-15: qty 1

## 2024-02-15 MED ORDER — ONDANSETRON HCL 4 MG/2ML IJ SOLN: 4.0000 mg | Freq: Four times a day (QID) | INTRAMUSCULAR | Status: DC | PRN

## 2024-02-15 MED ORDER — SODIUM CHLORIDE 0.9 % IV SOLN: INTRAVENOUS | Status: AC

## 2024-02-15 MED ORDER — HYDROCODONE-ACETAMINOPHEN 5-325 MG PO TABS: 1.0000 | ORAL_TABLET | ORAL | Status: DC | PRN

## 2024-02-15 MED ORDER — ENOXAPARIN SODIUM 60 MG/0.6ML IJ SOSY
0.5000 mg/kg | PREFILLED_SYRINGE | INTRAMUSCULAR | Status: DC
  Administered 2024-02-15: 50 mg via SUBCUTANEOUS
  Filled 2024-02-15: qty 0.6

## 2024-02-15 MED ORDER — IOHEXOL 350 MG/ML SOLN
75.0000 mL | Freq: Once | INTRAVENOUS | Status: AC | PRN
  Administered 2024-02-15: 75 mL via INTRAVENOUS

## 2024-02-15 NOTE — ED Triage Notes (Signed)
 Pt states spinal surgery this past Wednesday, pt states she has know noticed bilateral lower leg swelling, pt denies HX of CHF. PT endorses SVT and states "I have had some episodes since last night".

## 2024-02-15 NOTE — H&P (View-Only) (Signed)
 Hospital Consult    Reason for Consult:  Right Lower Extremity DVT Requesting Physician:  Dr Mike Alcon MD MRN #:  [id number]  History of Present Illness: This is a 68 y.o. female with medical history significant of cervical radiculopathy with recent cervical spine surgery last week presenting with DVT.  Patient noted had cervical spine surgery with Dr. Jodeane Mulligan at the [facility] in [place].  Patient reports decreased mobility at home.  Worsening right lower extremity swelling and pain over similar timeframe.  No reported trauma.  No reported recent extended travel outside the country.  Not on OCPs.  Denies any prior history of blood clots in the past.  Noted had multiple orthopedic surgeries to the right foot including bunionectomy, hammertoe surgery as well as tendon repair.    Presented to the ER afebrile, hemodynamically stable.  Satting well on room air.  White count 5.9, hemoglobin 14.2, platelets 179, creatinine 0.79.  Lower extremity ultrasound noted with deep venous thrombosis is noted in the right common femoral, saphenofemoral junction, posterior tibial and profunda femoral Veins.  Patient does not complain of pain to her right lower extremity but is very much bothered by the swelling.  Vascular surgery is consulted to evaluate.   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   Arthritis    knees   PONV (postoperative nausea and vomiting)    after D&C and Cholecystectomy   SVT (supraventricular tachycardia) (HCC)     Past Surgical History:  Procedure Laterality Date   BUNIONECTOMY Right    BUNIONECTOMY Right 01/22/2020   Procedure: BUNIONECTOMY LAPIDUS TYPE + AKIN RIGHT;  Surgeon: Pink Bridges, DPM;  Location: [facility];  Service: Podiatry;  Laterality: Right;  pre op anes. pop/saph block   CHOLECYSTECTOMY     COLONOSCOPY     DILATION AND  CURETTAGE OF UTERUS     FLAT FOOT CORRECTION Right 01/22/2020   Procedure: EVANS/MCDO;  Surgeon: Pink Bridges, DPM;  Location: [facility];  Service: Podiatry;  Laterality: Right;   HAMMER TOE SURGERY Right 01/22/2020   Procedure: HAMMER TOE CORRECTION;  Surgeon: Pink Bridges, DPM;  Location: [facility];  Service: Podiatry;  Laterality: Right;   TENDON REPAIR Right 10/24/2020   Procedure: FLEXOR TENDON REPAIR SECONDARY RIGHT; NONUNION REPAIR SECONDARDY RIGHT;  Surgeon: Anell Baptist, DPM;  Location: [facility];  Service: Podiatry;  Laterality: Right;    Allergies  Allergen Reactions   Lactose Diarrhea    Other reaction(s): Abdominal Pain  Other Reaction(s): abdominal pain, other   Meloxicam  Palpitations   Gluten Meal Diarrhea    Also, - Chills, "sweats", weakness   Lactose Intolerance (Gi) Other (See Comments)    Stomach cramps   Voltaren [Diclofenac]     RESMBLES AN HEART ATTACK    Prior to Admission medications   Medication Sig Start Date End Date Taking? Authorizing Provider  metoprolol  succinate (TOPROL -XL) 50 MG 24 hr tablet Take 1 tablet (50 mg total) by mouth every evening. Take with or immediately following a meal. 10/21/23   Ardeen Kohler, MD  sodium chloride  (OCEAN) 0.65 % SOLN nasal spray Place 1 spray into both nostrils 2 (two) times daily.    [provider]    Social History   Socioeconomic History   Marital status: Married    Spouse name: Not on file   Number of children: Not on  file   Years of education: Not on file   Highest education level: Not on file  Occupational History   Not on file  Tobacco Use   Smoking status: Never   Smokeless tobacco: Never  Vaping Use   Vaping status: Never Used  Substance and Sexual Activity   Alcohol use: Not Currently   Drug use: Never   Sexual activity: Not Currently  Other Topics Concern   Not on file  Social History Narrative   Not on file   Social Drivers of Health   Financial Resource  Strain: Low Risk  (08/01/2023)   Received from [facility]   Overall Financial Resource Strain (CARDIA)    Difficulty of Paying Living Expenses: Not hard at all  Food Insecurity: No Food Insecurity (08/01/2023)   Received from [facility]   Hunger Vital Sign    Worried About Running Out of Food in the Last Year: Never true    Ran Out of Food in the Last Year: Never true  Transportation Needs: No Transportation Needs (08/01/2023)   Received from [facility] - Transportation    In the past 12 months, has lack of transportation kept you from medical appointments or from getting medications?: No    Lack of Transportation (Non-Medical): No  Physical Activity: Not on file  Stress: Not on file  Social Connections: Not on file  Intimate Partner Violence: Not on file     Family History  Problem Relation Age of Onset   Breast cancer Neg Hx     ROS: Otherwise negative unless mentioned in HPI  Physical Examination  Vitals:   02/15/24 0948  BP: 128/85  Pulse: 85  Resp: 16  Temp: 98 F (36.7 C)  SpO2: 99%   Body mass index is 33.45 kg/m.  General:  WDWN in NAD Gait: Not observed HENT: WNL, normocephalic Pulmonary: normal non-labored breathing, without Rales, rhonchi,  wheezing Cardiac: regular, without  Murmurs, rubs or gallops; without carotid bruits Abdomen: Bowel sounds throughout, soft, NT/ND, no masses Skin: without rashes Vascular Exam/Pulses: Bilateral upper extremities with palpable pulses.  Bilateral lower extremities unable to palpate pulses due to edema in the right leg but palpable pulses in the left leg. Extremities: without ischemic changes, without Gangrene , without cellulitis; without open wounds;  Musculoskeletal: no muscle wasting or atrophy  Neurologic: A&O X 3;  No focal weakness or paresthesias are detected; speech is fluent/normal Psychiatric:  The pt has Normal affect. Lymph:   Unremarkable  CBC    Component Value Date/Time   WBC 5.9 02/15/2024 0954   RBC 4.72 02/15/2024 0954   HGB 14.2 02/15/2024 0954   HCT 42.6 02/15/2024 0954   PLT 179 02/15/2024 0954   MCV 90.3 02/15/2024 0954   MCH 30.1 02/15/2024 0954   MCHC 33.3 02/15/2024 0954   RDW 13.1 02/15/2024 0954   LYMPHSABS 1.1 02/15/2024 0954   MONOABS 0.3 02/15/2024 0954   EOSABS 0.2 02/15/2024 0954   BASOSABS 0.1 02/15/2024 0954    BMET    Component Value Date/Time   NA 137 02/15/2024 0954   K 4.3 02/15/2024 0954   CL 106 02/15/2024 0954   CO2 23 02/15/2024 0954   GLUCOSE 104 (H) 02/15/2024 0954   BUN 12 02/15/2024 0954   CREATININE 0.79 02/15/2024 0954   CALCIUM 9.1 02/15/2024 0954   GFRNONAA >60 02/15/2024 0954    COAGS: No results found for: "INR", "PROTIME"   Non-Invasive Vascular  Imaging:   EXAM:02/15/24 CT ANGIOGRAPHY CHEST WITH CONTRAST   TECHNIQUE: Multidetector CT imaging of the chest was performed using the standard protocol during bolus administration of intravenous contrast. Multiplanar CT image reconstructions and MIPs were obtained to evaluate the vascular anatomy.   RADIATION DOSE REDUCTION: This exam was performed according to the departmental dose-optimization program which includes automated exposure control, adjustment of the mA and/or kV according to patient size and/or use of iterative reconstruction technique.   CONTRAST:  75mL OMNIPAQUE IOHEXOL 350 MG/ML SOLN   COMPARISON:  Duplex ultrasound of same day.   FINDINGS: Cardiovascular: Mild cardiomegaly. No pericardial effusion. There is noted a small filling defect seen in the proximal portion of the left pulmonary artery which may represent possible scanning artifact, although small acute or chronic pulmonary embolus cannot be excluded. No other filling defect or pulmonary embolus is noted.   Mediastinum/Nodes: No enlarged mediastinal, hilar, or axillary lymph nodes. Thyroid  gland, trachea, and esophagus  demonstrate no significant findings.   Lungs/Pleura: Lungs are clear. No pleural effusion or pneumothorax.   Upper Abdomen: No acute abnormality.   Musculoskeletal: No chest wall abnormality. No acute or significant osseous findings.   Review of the MIP images confirms the above findings.   IMPRESSION: There is noted a small nonocclusive filling defect within the lumen of the proximal portion of the left pulmonary artery. This may represent possible scanning artifact, but small acute or chronic pulmonary embolus cannot be excluded given that the patient does have right lower extremity deep venous thrombosis based on exam today.   EXAM:02/15/24 BILATERAL LOWER EXTREMITY VENOUS DOPPLER ULTRASOUND   TECHNIQUE: Gray-scale sonography with graded compression, as well as color Doppler and duplex ultrasound were performed to evaluate the lower extremity deep venous systems from the level of the common femoral vein and including the common femoral, femoral, profunda femoral, popliteal and calf veins including the posterior tibial, peroneal and gastrocnemius veins when visible. The superficial great saphenous vein was also interrogated. Spectral Doppler was utilized to evaluate flow at rest and with distal augmentation maneuvers in the common femoral, femoral and popliteal veins.   COMPARISON:  None Available.   FINDINGS: RIGHT LOWER EXTREMITY   Common Femoral Vein: Partial compressibility is noted with some flow consistent with nonocclusive thrombus.   Saphenofemoral Junction: Partial compressibility is noted consistent with nonocclusive thrombus.   Profunda Femoral Vein: Partial compressibility consistent with nonocclusive thrombus.   Femoral Vein: No evidence of thrombus. Normal compressibility, respiratory phasicity and response to augmentation.   Popliteal Vein: No evidence of thrombus. Normal compressibility, respiratory phasicity and response to augmentation.   Calf  Veins: Noncompressible consistent with occlusive thrombus and posterior tibial veins. Peroneal vein is unremarkable.   Superficial Great Saphenous Vein: No evidence of thrombus. Normal compressibility.   Venous Reflux:  None.   Other Findings: Probable Baker's cyst seen in right popliteal fossa.   LEFT LOWER EXTREMITY   Common Femoral Vein: No evidence of thrombus. Normal compressibility, respiratory phasicity and response to augmentation.   Saphenofemoral Junction: No evidence of thrombus. Normal compressibility and flow on color Doppler imaging.   Profunda Femoral Vein: No evidence of thrombus. Normal compressibility and flow on color Doppler imaging.   Femoral Vein: No evidence of thrombus. Normal compressibility, respiratory phasicity and response to augmentation.   Popliteal Vein: No evidence of thrombus. Normal compressibility, respiratory phasicity and response to augmentation.   Calf Veins: No evidence of thrombus. Normal compressibility and flow on color Doppler imaging.   Superficial Great  Saphenous Vein: No evidence of thrombus. Normal compressibility.   Venous Reflux:  None.   Other Findings:  None.   IMPRESSION: Deep venous thrombosis is noted in the right common femoral, saphenofemoral junction, posterior tibial and profunda femoral veins. No evidence of deep venous thrombosis is noted in left lower extremity.  Statin:  No. Beta Blocker:  Yes.   Aspirin:  No. ACEI:  No. ARB:  No. CCB use:  No Other antiplatelets/anticoagulants:  No.    ASSESSMENT/PLAN: This is a 68 y.o. female who presents to [facility] emergency department due to +2 edema swelling to her right lower extremity.  Noted to have undergone an ACDF of her cervical spine 1 week prior.  She comes to the emergency room with [place] J collar in place.  She does endorse pain to her shoulders and her neck but denies any pain to her lower extremities.  Workup she underwent bilateral lower  extremity ultrasounds which revealed thrombosis of the right common femoral vein, saphenous femoral junction, posterior tibial and profundofemoral veins in the right lower extremity.  Underwent a CTA of the chest.  Due to filling defect within the proximal left pulmonary artery.  This could be identified as either thrombus or any artifact.  Consultation with Dr. Nance Aw of Neurosurgery surgery it has been deemed the patient is unable to receive any anticoagulation for at least 14 days minimum postoperative.  Therefore at this time vascular surgery recommends patient undergo inferior vena cava filter placement to help protect against any further dislodgment of her right lower extremity DVT causing pulmonary embolism.  I had a long detailed discussion with the patient at the bedside in the emergency department with her husband present this afternoon.  We discussed in detail the procedure, benefits, risk, and complications.  Both patient and husband verbalized her understanding.  They would like to proceed to soon as possible.  I answered all their questions this afternoon.  Patient will be made n.p.o. after midnight for procedure tomorrow on 02/16/2024.   -I discussed the case in detail with Dr. Devon Fogo MD and he agrees with the plan.   Annamaria [facility] 02/15/2024 4:05 PM

## 2024-02-15 NOTE — Consult Note (Signed)
 Hospital Consult    Reason for Consult:  Right Lower Extremity DVT Requesting Physician:  Dr Mike Alcon MD MRN #:  [id number]  History of Present Illness: This is a 68 y.o. female with medical history significant of cervical radiculopathy with recent cervical spine surgery last week presenting with DVT.  Patient noted had cervical spine surgery with Dr. Jodeane Mulligan at the [facility] in [place].  Patient reports decreased mobility at home.  Worsening right lower extremity swelling and pain over similar timeframe.  No reported trauma.  No reported recent extended travel outside the country.  Not on OCPs.  Denies any prior history of blood clots in the past.  Noted had multiple orthopedic surgeries to the right foot including bunionectomy, hammertoe surgery as well as tendon repair.    Presented to the ER afebrile, hemodynamically stable.  Satting well on room air.  White count 5.9, hemoglobin 14.2, platelets 179, creatinine 0.79.  Lower extremity ultrasound noted with deep venous thrombosis is noted in the right common femoral, saphenofemoral junction, posterior tibial and profunda femoral Veins.  Patient does not complain of pain to her right lower extremity but is very much bothered by the swelling.  Vascular surgery is consulted to evaluate.   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   Arthritis    knees   PONV (postoperative nausea and vomiting)    after D&C and Cholecystectomy   SVT (supraventricular tachycardia) (HCC)     Past Surgical History:  Procedure Laterality Date   BUNIONECTOMY Right    BUNIONECTOMY Right 01/22/2020   Procedure: BUNIONECTOMY LAPIDUS TYPE + AKIN RIGHT;  Surgeon: Pink Bridges, DPM;  Location: [facility];  Service: Podiatry;  Laterality: Right;  pre op anes. pop/saph block   CHOLECYSTECTOMY     COLONOSCOPY     DILATION AND  CURETTAGE OF UTERUS     FLAT FOOT CORRECTION Right 01/22/2020   Procedure: EVANS/MCDO;  Surgeon: Pink Bridges, DPM;  Location: [facility];  Service: Podiatry;  Laterality: Right;   HAMMER TOE SURGERY Right 01/22/2020   Procedure: HAMMER TOE CORRECTION;  Surgeon: Pink Bridges, DPM;  Location: [facility];  Service: Podiatry;  Laterality: Right;   TENDON REPAIR Right 10/24/2020   Procedure: FLEXOR TENDON REPAIR SECONDARY RIGHT; NONUNION REPAIR SECONDARDY RIGHT;  Surgeon: Anell Baptist, DPM;  Location: [facility] ORS;  Service: Podiatry;  Laterality: Right;    Allergies  Allergen Reactions   Lactose Diarrhea    Other reaction(s): Abdominal Pain  Other Reaction(s): abdominal pain, other   Meloxicam  Palpitations   Gluten Meal Diarrhea    Also, - Chills, "sweats", weakness   Lactose Intolerance (Gi) Other (See Comments)    Stomach cramps   Voltaren [Diclofenac]     RESMBLES AN HEART ATTACK    Prior to Admission medications   Medication Sig Start Date End Date Taking? Authorizing Provider  metoprolol  succinate (TOPROL -XL) 50 MG 24 hr tablet Take 1 tablet (50 mg total) by mouth every evening. Take with or immediately following a meal. 10/21/23   Ardeen Kohler, MD  sodium chloride  (OCEAN) 0.65 % SOLN nasal spray Place 1 spray into both nostrils 2 (two) times daily.    [provider]    Social History   Socioeconomic History   Marital status: Married    Spouse name: Not on file   Number of children: Not on  file   Years of education: Not on file   Highest education level: Not on file  Occupational History   Not on file  Tobacco Use   Smoking status: Never   Smokeless tobacco: Never  Vaping Use   Vaping status: Never Used  Substance and Sexual Activity   Alcohol use: Not Currently   Drug use: Never   Sexual activity: Not Currently  Other Topics Concern   Not on file  Social History Narrative   Not on file   Social Drivers of Health   Financial Resource  Strain: Low Risk  (08/01/2023)   Received from [facility]   Overall Financial Resource Strain (CARDIA)    Difficulty of Paying Living Expenses: Not hard at all  Food Insecurity: No Food Insecurity (08/01/2023)   Received from [facility]   Hunger Vital Sign    Worried About Running Out of Food in the Last Year: Never true    Ran Out of Food in the Last Year: Never true  Transportation Needs: No Transportation Needs (08/01/2023)   Received from [facility] - Transportation    In the past 12 months, has lack of transportation kept you from medical appointments or from getting medications?: No    Lack of Transportation (Non-Medical): No  Physical Activity: Not on file  Stress: Not on file  Social Connections: Not on file  Intimate Partner Violence: Not on file     Family History  Problem Relation Age of Onset   Breast cancer Neg Hx     ROS: Otherwise negative unless mentioned in HPI  Physical Examination  Vitals:   02/15/24 0948  BP: 128/85  Pulse: 85  Resp: 16  Temp: 98 F (36.7 C)  SpO2: 99%   Body mass index is 33.45 kg/m.  General:  WDWN in NAD Gait: Not observed HENT: WNL, normocephalic Pulmonary: normal non-labored breathing, without Rales, rhonchi,  wheezing Cardiac: regular, without  Murmurs, rubs or gallops; without carotid bruits Abdomen: Bowel sounds throughout, soft, NT/ND, no masses Skin: without rashes Vascular Exam/Pulses: Bilateral upper extremities with palpable pulses.  Bilateral lower extremities unable to palpate pulses due to edema in the right leg but palpable pulses in the left leg. Extremities: without ischemic changes, without Gangrene , without cellulitis; without open wounds;  Musculoskeletal: no muscle wasting or atrophy  Neurologic: A&O X 3;  No focal weakness or paresthesias are detected; speech is fluent/normal Psychiatric:  The pt has Normal affect. Lymph:   Unremarkable  CBC    Component Value Date/Time   WBC 5.9 02/15/2024 0954   RBC 4.72 02/15/2024 0954   HGB 14.2 02/15/2024 0954   HCT 42.6 02/15/2024 0954   PLT 179 02/15/2024 0954   MCV 90.3 02/15/2024 0954   MCH 30.1 02/15/2024 0954   MCHC 33.3 02/15/2024 0954   RDW 13.1 02/15/2024 0954   LYMPHSABS 1.1 02/15/2024 0954   MONOABS 0.3 02/15/2024 0954   EOSABS 0.2 02/15/2024 0954   BASOSABS 0.1 02/15/2024 0954    BMET    Component Value Date/Time   NA 137 02/15/2024 0954   K 4.3 02/15/2024 0954   CL 106 02/15/2024 0954   CO2 23 02/15/2024 0954   GLUCOSE 104 (H) 02/15/2024 0954   BUN 12 02/15/2024 0954   CREATININE 0.79 02/15/2024 0954   CALCIUM 9.1 02/15/2024 0954   GFRNONAA >60 02/15/2024 0954    COAGS: No results found for: "INR", "PROTIME"   Non-Invasive Vascular  Imaging:   EXAM:02/15/24 CT ANGIOGRAPHY CHEST WITH CONTRAST   TECHNIQUE: Multidetector CT imaging of the chest was performed using the standard protocol during bolus administration of intravenous contrast. Multiplanar CT image reconstructions and MIPs were obtained to evaluate the vascular anatomy.   RADIATION DOSE REDUCTION: This exam was performed according to the departmental dose-optimization program which includes automated exposure control, adjustment of the mA and/or kV according to patient size and/or use of iterative reconstruction technique.   CONTRAST:  75mL OMNIPAQUE IOHEXOL 350 MG/ML SOLN   COMPARISON:  Duplex ultrasound of same day.   FINDINGS: Cardiovascular: Mild cardiomegaly. No pericardial effusion. There is noted a small filling defect seen in the proximal portion of the left pulmonary artery which may represent possible scanning artifact, although small acute or chronic pulmonary embolus cannot be excluded. No other filling defect or pulmonary embolus is noted.   Mediastinum/Nodes: No enlarged mediastinal, hilar, or axillary lymph nodes. Thyroid  gland, trachea, and esophagus  demonstrate no significant findings.   Lungs/Pleura: Lungs are clear. No pleural effusion or pneumothorax.   Upper Abdomen: No acute abnormality.   Musculoskeletal: No chest wall abnormality. No acute or significant osseous findings.   Review of the MIP images confirms the above findings.   IMPRESSION: There is noted a small nonocclusive filling defect within the lumen of the proximal portion of the left pulmonary artery. This may represent possible scanning artifact, but small acute or chronic pulmonary embolus cannot be excluded given that the patient does have right lower extremity deep venous thrombosis based on exam today.   EXAM:02/15/24 BILATERAL LOWER EXTREMITY VENOUS DOPPLER ULTRASOUND   TECHNIQUE: Gray-scale sonography with graded compression, as well as color Doppler and duplex ultrasound were performed to evaluate the lower extremity deep venous systems from the level of the common femoral vein and including the common femoral, femoral, profunda femoral, popliteal and calf veins including the posterior tibial, peroneal and gastrocnemius veins when visible. The superficial great saphenous vein was also interrogated. Spectral Doppler was utilized to evaluate flow at rest and with distal augmentation maneuvers in the common femoral, femoral and popliteal veins.   COMPARISON:  None Available.   FINDINGS: RIGHT LOWER EXTREMITY   Common Femoral Vein: Partial compressibility is noted with some flow consistent with nonocclusive thrombus.   Saphenofemoral Junction: Partial compressibility is noted consistent with nonocclusive thrombus.   Profunda Femoral Vein: Partial compressibility consistent with nonocclusive thrombus.   Femoral Vein: No evidence of thrombus. Normal compressibility, respiratory phasicity and response to augmentation.   Popliteal Vein: No evidence of thrombus. Normal compressibility, respiratory phasicity and response to augmentation.   Calf  Veins: Noncompressible consistent with occlusive thrombus and posterior tibial veins. Peroneal vein is unremarkable.   Superficial Great Saphenous Vein: No evidence of thrombus. Normal compressibility.   Venous Reflux:  None.   Other Findings: Probable Baker's cyst seen in right popliteal fossa.   LEFT LOWER EXTREMITY   Common Femoral Vein: No evidence of thrombus. Normal compressibility, respiratory phasicity and response to augmentation.   Saphenofemoral Junction: No evidence of thrombus. Normal compressibility and flow on color Doppler imaging.   Profunda Femoral Vein: No evidence of thrombus. Normal compressibility and flow on color Doppler imaging.   Femoral Vein: No evidence of thrombus. Normal compressibility, respiratory phasicity and response to augmentation.   Popliteal Vein: No evidence of thrombus. Normal compressibility, respiratory phasicity and response to augmentation.   Calf Veins: No evidence of thrombus. Normal compressibility and flow on color Doppler imaging.   Superficial Great  Saphenous Vein: No evidence of thrombus. Normal compressibility.   Venous Reflux:  None.   Other Findings:  None.   IMPRESSION: Deep venous thrombosis is noted in the right common femoral, saphenofemoral junction, posterior tibial and profunda femoral veins. No evidence of deep venous thrombosis is noted in left lower extremity.  Statin:  No. Beta Blocker:  Yes.   Aspirin:  No. ACEI:  No. ARB:  No. CCB use:  No Other antiplatelets/anticoagulants:  No.    ASSESSMENT/PLAN: This is a 68 y.o. female who presents to [facility] emergency department due to +2 edema swelling to her right lower extremity.  Noted to have undergone an ACDF of her cervical spine 1 week prior.  She comes to the emergency room with [place] J collar in place.  She does endorse pain to her shoulders and her neck but denies any pain to her lower extremities.  Workup she underwent bilateral lower  extremity ultrasounds which revealed thrombosis of the right common femoral vein, saphenous femoral junction, posterior tibial and profundofemoral veins in the right lower extremity.  Underwent a CTA of the chest.  Due to filling defect within the proximal left pulmonary artery.  This could be identified as either thrombus or any artifact.  Consultation with Dr. Nance Aw of Neurosurgery surgery it has been deemed the patient is unable to receive any anticoagulation for at least 14 days minimum postoperative.  Therefore at this time vascular surgery recommends patient undergo inferior vena cava filter placement to help protect against any further dislodgment of her right lower extremity DVT causing pulmonary embolism.  I had a long detailed discussion with the patient at the bedside in the emergency department with her husband present this afternoon.  We discussed in detail the procedure, benefits, risk, and complications.  Both patient and husband verbalized her understanding.  They would like to proceed to soon as possible.  I answered all their questions this afternoon.  Patient will be made n.p.o. after midnight for procedure tomorrow on 02/16/2024.   -I discussed the case in detail with Dr. Devon Fogo MD and he agrees with the plan.   Annamaria [facility] 02/15/2024 4:05 PM

## 2024-02-15 NOTE — ED Notes (Signed)
 Per Dr. Jeris Montes who spoke with Dr. Dorinda Garland, the patient is allowed to lay flat for the CT Angio. This RN has called CT to inform them she is now ready for her CT scan.

## 2024-02-15 NOTE — H&P (Signed)
 History and Physical    Patient: Tammy Vang OZH:[id number] DOB: 02-14-1956 DOA: 02/15/2024 DOS: the patient was seen and examined on 02/15/2024 PCP: Rex Castor, MD  Patient coming from: Home  Chief Complaint:  Chief Complaint  Patient presents with   Leg Swelling   HPI: Tammy Vang is a 68 y.o. female with medical history significant of cervical radiculopathy with recent cervical spine surgery last week presenting with DVT.  Patient noted had cervical spine surgery with Dr. Jodeane Mulligan at the [employer]  [facility] in [place].  Patient reports decreased mobility at home.  Worsening right lower extremity swelling and pain over similar timeframe.  No reported trauma.  No reported recent extended travel outside the country.  Not on OCPs.  Denies any prior history of blood clots in the past.  Noted had multiple orthopedic surgeries to the right foot including bunionectomy, hammertoe surgery as well as tendon repair.  Non-smoker.  No reported alcohol use.   Presented to the ER afebrile, hemodynamically stable.  Satting well on room air.  White count 5.9, hemoglobin 14.2, platelets 179, creatinine 0.79.  Lower extremity ultrasound noted with deep venous thrombosis is noted in the right common femoral, saphenofemoral junction, posterior tibial and profunda femoral Veins.  Review of Systems: As mentioned in the history of present illness. All other systems reviewed and are negative. 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   Arthritis    knees   PONV (postoperative nausea and vomiting)    after D&C and Cholecystectomy   SVT (supraventricular tachycardia) (HCC)    Past Surgical History:  Procedure Laterality Date   BUNIONECTOMY Right    BUNIONECTOMY Right 01/22/2020   Procedure: BUNIONECTOMY LAPIDUS TYPE + AKIN RIGHT;  Surgeon: Pink Bridges, DPM;  Location: [facility];  Service: Podiatry;  Laterality: Right;  pre op anes. pop/saph block   CHOLECYSTECTOMY     COLONOSCOPY     DILATION AND CURETTAGE OF UTERUS     FLAT FOOT CORRECTION Right 01/22/2020   Procedure: EVANS/MCDO;  Surgeon: Pink Bridges, DPM;  Location: [facility];  Service: Podiatry;  Laterality: Right;   HAMMER TOE SURGERY Right 01/22/2020   Procedure: HAMMER TOE CORRECTION;  Surgeon: Pink Bridges, DPM;  Location: [facility];  Service: Podiatry;  Laterality: Right;   TENDON REPAIR Right 10/24/2020   Procedure: FLEXOR TENDON REPAIR SECONDARY RIGHT; NONUNION REPAIR SECONDARDY RIGHT;  Surgeon: Anell Baptist, DPM;  Location: [facility] ORS;  Service: Podiatry;  Laterality: Right;   Social History:  reports that she has never smoked. She has never used smokeless tobacco. She reports that she does not currently use alcohol. She reports that she does not use drugs.  Allergies  Allergen Reactions   Diclofenac Palpitations and Other (See Comments)    RESMBLES AN HEART ATTACK  Other Reaction(s): chest pain, irregular heart rate   Lactose Diarrhea and Other (See Comments)    Other reaction(s): Abdominal Pain  Other Reaction(s): abdominal pain, other   Meloxicam  Palpitations   Gluten Meal Diarrhea    Also, - Chills, "sweats", weakness   Lactose Intolerance (Gi) Other (See Comments)    Stomach cramps    Family History  Problem Relation Age of Onset   Breast cancer Neg Hx     Prior to Admission medications   Medication Sig Start Date End Date Taking? Authorizing Provider  Docusate Sodium (DSS) 100  MG CAPS Take 1 capsule by mouth 2 (two) times daily.   Yes [provider]  HYDROcodone -acetaminophen  (NORCO/VICODIN) 5-325 MG tablet Take 1 tablet by mouth every 4 (four) hours.   Yes [provider]  metoprolol  succinate (TOPROL -XL) 50 MG 24 hr tablet Take 1 tablet (50 mg total) by mouth every evening. Take with or immediately following a meal. 10/21/23  Yes Ardeen Kohler, MD  olopatadine (PATANOL) 0.1 % ophthalmic solution Place 1 drop into both eyes 2 (two) times daily as needed for allergies.   Yes [provider]  sodium chloride  (OCEAN) 0.65 % SOLN nasal spray Place 1 spray into both nostrils 2 (two) times daily.   Yes [provider]  ondansetron  (ZOFRAN -ODT) 4 MG disintegrating tablet Take 8 mg by mouth every 8 (eight) hours as needed for nausea.    [provider]    Physical Exam: Vitals:   02/15/24 0948 02/15/24 0952 02/15/24 1600  BP: 128/85  136/86  Pulse: 85  77  Resp: 16  18  Temp: 98 F (36.7 C)    TempSrc: Oral    SpO2: 99%  100%  Weight: 99.8 kg    Height: 5\' 8"  (1.727 m) 5\' 8"  (1.727 m)    Physical Exam Constitutional:      Appearance: She is obese.  HENT:     Head: Normocephalic and atraumatic.     Comments: Neck brace in place      Nose: Nose normal.  Cardiovascular:     Rate and Rhythm: Normal rate and regular rhythm.  Pulmonary:     Effort: Pulmonary effort is normal.  Abdominal:     General: Bowel sounds are normal.  Musculoskeletal:        General: Normal range of motion.  Neurological:     General: No focal deficit present.  Psychiatric:        Mood and Affect: Mood normal.     Data Reviewed:  There are no new results to review at this time.  DG Chest 2 View CLINICAL DATA:  Bilateral lower leg swelling status post recent spinal surgery.  EXAM: CHEST - 2 VIEW  COMPARISON:  July 24, 2023  FINDINGS: The heart size and mediastinal contours are within normal limits. Low lung volumes are noted. Both lungs are clear. Radiopaque surgical clips are seen within the right upper quadrant. Postoperative changes are seen throughout the cervical spine. No acute osseous abnormalities are identified.  IMPRESSION: No active cardiopulmonary disease.  Electronically Signed   By: Virgle Grime M.D.   On: 02/15/2024 14:33 US  Venous Img Lower Bilateral CLINICAL DATA:   Bilateral lower extremity swelling for 5 days.  EXAM: BILATERAL LOWER EXTREMITY VENOUS DOPPLER ULTRASOUND  TECHNIQUE: Gray-scale sonography with graded compression, as well as color Doppler and duplex ultrasound were performed to evaluate the lower extremity deep venous systems from the level of the common femoral vein and including the common femoral, femoral, profunda femoral, popliteal and calf veins including the posterior tibial, peroneal and gastrocnemius veins when visible. The superficial great saphenous vein was also interrogated. Spectral Doppler was utilized to evaluate flow at rest and with distal augmentation maneuvers in the common femoral, femoral and popliteal veins.  COMPARISON:  None Available.  FINDINGS: RIGHT LOWER EXTREMITY  Common Femoral Vein: Partial compressibility is noted with some flow consistent with nonocclusive thrombus.  Saphenofemoral Junction: Partial compressibility is noted consistent with nonocclusive thrombus.  Profunda Femoral Vein: Partial compressibility consistent with nonocclusive thrombus.  Femoral Vein: No  evidence of thrombus. Normal compressibility, respiratory phasicity and response to augmentation.  Popliteal Vein: No evidence of thrombus. Normal compressibility, respiratory phasicity and response to augmentation.  Calf Veins: Noncompressible consistent with occlusive thrombus and posterior tibial veins. Peroneal vein is unremarkable.  Superficial Great Saphenous Vein: No evidence of thrombus. Normal compressibility.  Venous Reflux:  None.  Other Findings: Probable Baker's cyst seen in right popliteal fossa.  LEFT LOWER EXTREMITY  Common Femoral Vein: No evidence of thrombus. Normal compressibility, respiratory phasicity and response to augmentation.  Saphenofemoral Junction: No evidence of thrombus. Normal compressibility and flow on color Doppler imaging.  Profunda Femoral Vein: No evidence of thrombus.  Normal compressibility and flow on color Doppler imaging.  Femoral Vein: No evidence of thrombus. Normal compressibility, respiratory phasicity and response to augmentation.  Popliteal Vein: No evidence of thrombus. Normal compressibility, respiratory phasicity and response to augmentation.  Calf Veins: No evidence of thrombus. Normal compressibility and flow on color Doppler imaging.  Superficial Great Saphenous Vein: No evidence of thrombus. Normal compressibility.  Venous Reflux:  None.  Other Findings:  None.  IMPRESSION: Deep venous thrombosis is noted in the right common femoral, saphenofemoral junction, posterior tibial and profunda femoral veins. No evidence of deep venous thrombosis is noted in left lower extremity.  Electronically Signed   By: Rosalene Colon M.D.   On: 02/15/2024 14:23  Lab Results  Component Value Date   WBC 5.9 02/15/2024   HGB 14.2 02/15/2024   HCT 42.6 02/15/2024   MCV 90.3 02/15/2024   PLT 179 02/15/2024   Last metabolic panel Lab Results  Component Value Date   GLUCOSE 104 (H) 02/15/2024   NA 137 02/15/2024   K 4.3 02/15/2024   CL 106 02/15/2024   CO2 23 02/15/2024   BUN 12 02/15/2024   CREATININE 0.79 02/15/2024   GFRNONAA >60 02/15/2024   CALCIUM 9.1 02/15/2024   PROT 6.9 02/15/2024   ALBUMIN 4.0 02/15/2024   BILITOT 0.6 02/15/2024   ALKPHOS 57 02/15/2024   AST 20 02/15/2024   ALT 14 02/15/2024   ANIONGAP 8 02/15/2024    Assessment and Plan: * DVT (deep venous thrombosis) (HCC) Noted Deep venous thrombosis is noted in the right common femoral, saphenofemoral junction, posterior tibial and profunda femoral veins on imaging  Vascular surgery consulted for possible intervention  Case also recent discussed with neurosurgery in setting of recent neck surgery Per Dr. Jeris Montes, patient will likely need IVC filter with deferring of anticoagulation unless PE present Okay for prophylactic anticoagulation Follow-up formal  vascular surgery recommendations Monitor   H/O cervical spine surgery Noted recent cervical spine surgery with Dr. Jodeane Mulligan at [facility] in [place] on April 30 Dr. Mont Antis with neurosurgery consulted with noted secondary curbside to Dr. Tenna Fees Neck pain grossly stable at present-continue home Vicodin Follow-up recommendations from neurosurgery Otherwise monitor  SVT (supraventricular tachycardia) (HCC) Rate controlled  Cont toprol         Advance Care Planning:   Code Status: Full Code   Consults: Vascular surgery, NSG   Family Communication: Husband at the bedside   Severity of Illness: The appropriate patient status for this patient is OBSERVATION. Observation status is judged to be reasonable and necessary in order to provide the required intensity of service to ensure the patient's safety. The patient's presenting symptoms, physical exam findings, and initial radiographic and laboratory data in the context of their medical condition is felt to place them at decreased risk for further clinical deterioration. Furthermore,  it is anticipated that the patient will be medically stable for discharge from the hospital within 2 midnights of admission.   Author: Corrinne Din, MD 02/15/2024 5:02 PM  For on call review [email or web address].

## 2024-02-15 NOTE — ED Provider Notes (Signed)
 [facility] Provider Note    Event Date/Time   First MD Initiated Contact with Patient 02/15/24 1118     (approximate)   History   Leg Swelling   HPI  Tammy Vang is a 68 y.o. female patient with a history of SVT, without history of CAD who comes in with concerns for bilateral leg swelling.  Patient reports that she has noticed some swelling in her bilateral legs for the past few days.  She does report having a surgery on her neck about a week ago.  She does report still getting up every day to go to the bathroom.  She does report some swelling in her legs before but this was in the setting of having a right leg surgery for prior ankle injuries.  She denies being on any fluid pills.  She does report having intermittent SVT which she occasionally gets and does report having a few episodes last night that were able to break on their own.  She does report being compliant with her metoprolol .  She takes chronic hydrocodone  and has not had a dose yet today.  She denies any chest pain, shortness of breath.   Physical Exam   Triage Vital Signs: ED Triage Vitals  Encounter Vitals Group     BP 02/15/24 0948 128/85     Systolic BP Percentile --      Diastolic BP Percentile --      Pulse Rate 02/15/24 0948 85     Resp 02/15/24 0948 16     Temp 02/15/24 0948 98 F (36.7 C)     Temp Source 02/15/24 0948 Oral     SpO2 02/15/24 0948 99 %     Weight 02/15/24 0948 220 lb (99.8 kg)     Height 02/15/24 0948 5\' 8"  (1.727 m)     Head Circumference --      Peak Flow --      Pain Score 02/15/24 0952 8     Pain Loc --      Pain Education --      Exclude from Growth Chart --     Most recent vital signs: Vitals:   02/15/24 0948  BP: 128/85  Pulse: 85  Resp: 16  Temp: 98 F (36.7 C)  SpO2: 99%     General: Awake, no distress.  CV:  Good peripheral perfusion.  Resp:  Normal effort.  Abd:  No distention.  Other:  Patient has some edema noted to bilateral legs  slightly worse on the right than the left good distal pulses   ED Results / Procedures / Treatments   Labs (all labs ordered are listed, but only abnormal results are displayed) Labs Reviewed  COMPREHENSIVE METABOLIC PANEL WITH GFR - Abnormal; Notable for the following components:      Result Value   Glucose, Bld 104 (*)    All other components within normal limits  CBC WITH DIFFERENTIAL/PLATELET  BRAIN NATRIURETIC PEPTIDE     EKG  My interpretation of EKG:  Sinus rate of 87 without any ST elevation or T wave inversions, normal intervals  RADIOLOGY I have reviewed the jus personally and interpreted no evidence of edema    PROCEDURES:  Critical Care performed: No  Procedures   MEDICATIONS ORDERED IN ED: Medications  HYDROcodone -acetaminophen  (NORCO/VICODIN) 5-325 MG per tablet 1 tablet (1 tablet Oral Given 02/15/24 1225)     IMPRESSION / MDM / ASSESSMENT AND PLAN / ED COURSE  I reviewed the triage vital  signs and the nursing notes.   Patient's presentation is most consistent with acute presentation with potential threat to life or bodily function.   Differential includes CHF, DVT, hepatic failure, nephrotic syndrome, venous stasis.  No evidence of arterial issue.  No rash.  Patient is due for her home hydrocodone  pain medications I given her a dose of that here.  Troponin is negative and symptoms have been ongoing for more than 3 hours.  CBC reassuring CMP shows no evidence of liver issues.  BNP is normal . 2:17 PM  Is doing well feeling much more comfortable with her chronic pain medication from her recent surgery.  Updated that we are just waiting on the results to be read and getting a urine.  She expressed understanding and tolerating p.o.  2:50 PM Discussed with Dr Mont Antis given pt had cervical spine funsion on 4/30 with [facility] (doesn't follow here) to see If anticoagulation is okay to start.   Vascular has come by to see patient we discussed and will  do 24 hours of heparin to monitor patient's response as well as to ensure no issues with the cervical spine recent fusion.  Waiting to start the heparin until neurosurgery approves.  He is currently in surgery.  Patient will be admitted to Dr. Daisey Dryer and he will follow-up on ordering the heparin     FINAL CLINICAL IMPRESSION(S) / ED DIAGNOSES   Final diagnoses:  Leg edema  Acute deep vein thrombosis (DVT) of proximal vein of right lower extremity (HCC)     Rx / DC Orders   ED Discharge Orders     None        Note:  This document was prepared using Dragon voice recognition software and may include unintentional dictation errors.   Lubertha Rush, MD 02/15/24 1520

## 2024-02-15 NOTE — Assessment & Plan Note (Signed)
 Rate controlled  Cont toprol 

## 2024-02-15 NOTE — ED Notes (Signed)
 Patient transported to CT

## 2024-02-15 NOTE — Assessment & Plan Note (Signed)
 Noted Deep venous thrombosis is noted in the right common femoral, saphenofemoral junction, posterior tibial and profunda femoral veins on imaging  Vascular surgery consulted for possible intervention  Case also recent discussed with neurosurgery in setting of recent neck surgery Per Dr. Jeris Montes, patient will likely need IVC filter with deferring of anticoagulation unless PE present Okay for prophylactic anticoagulation Follow-up formal vascular surgery recommendations Monitor

## 2024-02-15 NOTE — Consult Note (Signed)
 Brief consult note.  This is a patient of Jodeane Mulligan, orthopedic spine specialist at [facility].  She had a posterior cervical surgery 7 days ago, but presented with leg swelling.  She was found to have a common femoral DVT.  She does not have an oxygen requirement. Evaluation for PE is pending.  I have spoken with Dr. Tenna Fees.  Plan for:  - eval for PE - If no PE, would recommend IVC Filter placement with prophylactic anticoagulation - OK to therapeutically anticoagulate 14 days post-operatively - IF there is a PE and it is significant, it is ok to initiate therapeutic anticoagulation. - I have reviewed with Dr. Prescilla Brod in vascular surgery

## 2024-02-15 NOTE — Assessment & Plan Note (Signed)
 Noted recent cervical spine surgery with Dr. Jodeane Mulligan at [facility] in [place] on April 30 Dr. Mont Antis with neurosurgery consulted with noted secondary curbside to Dr. Tenna Fees Neck pain grossly stable at present-continue home Vicodin Follow-up recommendations from neurosurgery Otherwise monitor

## 2024-02-16 ENCOUNTER — Encounter: Payer: Self-pay | Admitting: Vascular Surgery

## 2024-02-16 ENCOUNTER — Other Ambulatory Visit: Payer: Self-pay

## 2024-02-16 ENCOUNTER — Encounter
Admission: EM | Disposition: A | Discharge status: Home / Self Care | Payer: Self-pay | Source: Home / Self Care | Attending: Emergency Medicine

## 2024-02-16 DIAGNOSIS — Z9889 Other specified postprocedural states: Secondary | ICD-10-CM | POA: Diagnosis not present

## 2024-02-16 DIAGNOSIS — I824Y1 Acute embolism and thrombosis of unspecified deep veins of right proximal lower extremity: Secondary | ICD-10-CM | POA: Diagnosis not present

## 2024-02-16 DIAGNOSIS — I82411 Acute embolism and thrombosis of right femoral vein: Secondary | ICD-10-CM | POA: Diagnosis not present

## 2024-02-16 DIAGNOSIS — R600 Localized edema: Secondary | ICD-10-CM | POA: Diagnosis not present

## 2024-02-16 DIAGNOSIS — I824Y9 Acute embolism and thrombosis of unspecified deep veins of unspecified proximal lower extremity: Secondary | ICD-10-CM | POA: Diagnosis not present

## 2024-02-16 DIAGNOSIS — I82412 Acute embolism and thrombosis of left femoral vein: Secondary | ICD-10-CM | POA: Diagnosis not present

## 2024-02-16 HISTORY — PX: IVC FILTER INSERTION: CATH118245

## 2024-02-16 LAB — CBC
HCT: 40.8 % (ref 36.0–46.0)
Hemoglobin: 13.4 g/dL (ref 12.0–15.0)
MCH: 30 pg (ref 26.0–34.0)
MCHC: 32.8 g/dL (ref 30.0–36.0)
MCV: 91.3 fL (ref 80.0–100.0)
Platelets: 170 10*3/uL (ref 150–400)
RBC: 4.47 MIL/uL (ref 3.87–5.11)
RDW: 13 % (ref 11.5–15.5)
WBC: 6.1 10*3/uL (ref 4.0–10.5)
nRBC: 0 % (ref 0.0–0.2)

## 2024-02-16 LAB — COMPREHENSIVE METABOLIC PANEL WITH GFR
ALT: 15 U/L (ref 0–44)
AST: 18 U/L (ref 15–41)
Albumin: 3.8 g/dL (ref 3.5–5.0)
Alkaline Phosphatase: 54 U/L (ref 38–126)
Anion gap: 9 (ref 5–15)
BUN: 11 mg/dL (ref 8–23)
CO2: 24 mmol/L (ref 22–32)
Calcium: 9.1 mg/dL (ref 8.9–10.3)
Chloride: 105 mmol/L (ref 98–111)
Creatinine, Ser: 0.66 mg/dL (ref 0.44–1.00)
GFR, Estimated: >60 mL/min (ref >60)
Glucose, Bld: 92 mg/dL (ref 70–99)
Potassium: 3.6 mmol/L (ref 3.5–5.1)
Sodium: 138 mmol/L (ref 135–145)
Total Bilirubin: 0.7 mg/dL (ref 0.0–1.2)
Total Protein: 6.4 g/dL — ABNORMAL LOW (ref 6.5–8.1)

## 2024-02-16 LAB — HIV ANTIBODY (ROUTINE TESTING W REFLEX): HIV Screen 4th Generation wRfx: NONREACTIVE

## 2024-02-16 SURGERY — IVC FILTER INSERTION
Anesthesia: Moderate Sedation

## 2024-02-16 MED ORDER — FENTANYL CITRATE (PF) 100 MCG/2ML IJ SOLN
INTRAMUSCULAR | Status: AC
  Filled 2024-02-16: qty 2

## 2024-02-16 MED ORDER — FAMOTIDINE 20 MG PO TABS: 40.0000 mg | ORAL_TABLET | Freq: Once | ORAL | Status: DC | PRN

## 2024-02-16 MED ORDER — IODIXANOL 320 MG/ML IV SOLN
INTRAVENOUS | Status: DC | PRN
  Administered 2024-02-16: 30 mL

## 2024-02-16 MED ORDER — MIDAZOLAM HCL 2 MG/ML PO SYRP
8.0000 mg | ORAL_SOLUTION | Freq: Once | ORAL | Status: DC | PRN
Start: 2024-02-16 — End: 2024-02-16

## 2024-02-16 MED ORDER — ENOXAPARIN SODIUM 60 MG/0.6ML IJ SOSY
0.5000 mg/kg | PREFILLED_SYRINGE | Freq: Once | INTRAMUSCULAR | Status: AC
  Administered 2024-02-16: 50 mg via SUBCUTANEOUS
  Filled 2024-02-16: qty 0.6

## 2024-02-16 MED ORDER — MIDAZOLAM HCL 2 MG/2ML IJ SOLN
INTRAMUSCULAR | Status: AC
  Filled 2024-02-16: qty 2

## 2024-02-16 MED ORDER — MIDAZOLAM HCL 2 MG/2ML IJ SOLN
INTRAMUSCULAR | Status: AC
Start: 2024-02-16 — End: ?
  Filled 2024-02-16: qty 2

## 2024-02-16 MED ORDER — SODIUM CHLORIDE 0.9 % IV SOLN: INTRAVENOUS | Status: DC

## 2024-02-16 MED ORDER — HEPARIN (PORCINE) IN NACL 1000-0.9 UT/500ML-% IV SOLN
INTRAVENOUS | Status: DC | PRN
  Administered 2024-02-16: 500 mL

## 2024-02-16 MED ORDER — FENTANYL CITRATE (PF) 100 MCG/2ML IJ SOLN
INTRAMUSCULAR | Status: DC | PRN
  Administered 2024-02-16: 50 ug via INTRAVENOUS

## 2024-02-16 MED ORDER — CEFAZOLIN SODIUM-DEXTROSE 2-4 GM/100ML-% IV SOLN
INTRAVENOUS | Status: AC
  Filled 2024-02-16: qty 100

## 2024-02-16 MED ORDER — METHYLPREDNISOLONE SODIUM SUCC 125 MG IJ SOLR: 125.0000 mg | Freq: Once | INTRAMUSCULAR | Status: DC | PRN

## 2024-02-16 MED ORDER — DIPHENHYDRAMINE HCL 50 MG/ML IJ SOLN: 50.0000 mg | Freq: Once | INTRAMUSCULAR | Status: DC | PRN

## 2024-02-16 MED ORDER — ENOXAPARIN SODIUM 60 MG/0.6ML IJ SOSY
0.5000 mg/kg | PREFILLED_SYRINGE | INTRAMUSCULAR | 0 refills | Status: DC
  Filled 2024-02-16: qty 3.6, 6d supply, fill #0

## 2024-02-16 MED ORDER — MIDAZOLAM HCL 2 MG/2ML IJ SOLN
INTRAMUSCULAR | Status: DC | PRN
  Administered 2024-02-16: 2 mg via INTRAVENOUS

## 2024-02-16 MED ORDER — LIDOCAINE HCL (PF) 1 % IJ SOLN
INTRAMUSCULAR | Status: DC | PRN
  Administered 2024-02-16: 10 mL

## 2024-02-16 MED ORDER — CEFAZOLIN SODIUM-DEXTROSE 2-4 GM/100ML-% IV SOLN
2.0000 g | INTRAVENOUS | Status: DC
  Filled 2024-02-16: qty 100

## 2024-02-16 SURGICAL SUPPLY — 9 items
COVER PROBE ULTRASOUND 5X96 (MISCELLANEOUS) IMPLANT
DRAPE BRACHIAL (DRAPES) IMPLANT
KIT FEMORAL DEL DENALI (Miscellaneous) IMPLANT
NDL ENTRY 21GA 7CM ECHOTIP (NEEDLE) IMPLANT
NEEDLE ENTRY 21GA 7CM ECHOTIP (NEEDLE) ×1 IMPLANT
PACK ANGIOGRAPHY (CUSTOM PROCEDURE TRAY) ×1 IMPLANT
SET INTRO CAPELLA COAXIAL (SET/KITS/TRAYS/PACK) IMPLANT
SUT MNCRL AB 4-0 PS2 18 (SUTURE) IMPLANT
WIRE SUPRACORE 190CM (WIRE) IMPLANT

## 2024-02-16 NOTE — Op Note (Signed)
 [facility]   OPERATIVE NOTE    PRE-OPERATIVE DIAGNOSIS: DVT with recent C-spine surgery  POST-OPERATIVE DIAGNOSIS: Same  PROCEDURE: 1.   Ultrasound guidance for vascular access to the right common femoral vein 2.   Catheter placement into the inferior vena cava 3.   Inferior venacavogram 4.   Placement of a Denali IVC filter  SURGEON: Devon Fogo  ASSISTANT(S): None  ANESTHESIA: Conscious sedation was administered by the interventional radiology RN under my direct supervision. IV Versed  plus fentanyl  were utilized. Continuous ECG, pulse oximetry and blood pressure was monitored throughout the entire procedure. Conscious sedation was for a total of 20 minutes.  ESTIMATED BLOOD LOSS: minimal  FINDING(S): 1.  Patent IVC  SPECIMEN(S):  none  INDICATIONS:   Tammy Vang is a 68 y.o. y.o. female who presents with DVT of the left lower extremity including the common femoral vein.  She is postop day 8 from C-spine surgery and therefore cannot be anticoagulated.  Inferior vena cava filter is indicated for this reason.  Risks and benefits including filter thrombosis, migration, fracture, bleeding, and infection were all discussed.  We discussed that all IVC filters that we place can be removed if desired from the patient once the need for the filter has passed.    DESCRIPTION: After obtaining full informed written consent, the patient was brought back to the vascular suite. The skin was sterilely prepped and draped in a sterile surgical field was created. Ultrasound was placed in a sterile sleeve. The right common femoral was echolucent and compressible indicating patency. Image was recorded for the permanent record. The puncture was made under continuous real-time ultrasound guidance.  The right common femoral vein was accessed under direct ultrasound guidance without difficulty with a micropuncture needle. Microwire was then advanced under fluoroscopic guidance  without difficulty. Micro-sheath was then inserted and a J-wire was then placed. The dilator is passed over the wire and the delivery sheath was placed into the inferior vena cava.  Inferior venacavogram was performed. This demonstrated a patent IVC with the level of the renal veins at L2.  The filter was then deployed into the inferior vena cava at the level of L3 just below the renal veins. The delivery sheath was then removed. Pressure was held. Sterile dressings were placed. The patient tolerated the procedure well and was taken to the recovery room in stable condition.  Interpretation: IVC is widely patent.  Renal blushes are at the L2 level.  The vena cava measures 18 mm in diameter.  Denali filter is deployed at the L3 level and upright orientation.  COMPLICATIONS: None  CONDITION: Stable  Devon Fogo  02/16/2024, 9:11 AM

## 2024-02-16 NOTE — Discharge Summary (Signed)
 Tammy Vang QIO:[id number] DOB: 1955/12/11 DOA: 02/15/2024  PCP: Rex Castor, MD  Admit date: 02/15/2024 Discharge date: 02/16/2024  Time spent: 35 minutes  Recommendations for Outpatient Follow-up:  Pcp f/u 1 week Orthopedics f/u 1 week Vascular surgery f/u 3 weeks     Discharge Diagnoses:  Principal Problem:   DVT (deep venous thrombosis) (HCC) Active Problems:   H/O cervical spine surgery   SVT (supraventricular tachycardia) (HCC)   Leg edema   Discharge Condition: stable  Diet recommendation: heart healthy  Filed Weights   02/15/24 0948 02/16/24 0741  Weight: 99.8 kg 99.8 kg    History of present illness:  From admission h and p Tammy Vang is a 68 y.o. female with medical history significant of cervical radiculopathy with recent cervical spine surgery last week presenting with DVT.  Patient noted had cervical spine surgery with Dr. Jodeane Mulligan at the [facility] in [place].  Patient reports decreased mobility at home.  Worsening right lower extremity swelling and pain over similar timeframe.  No reported trauma.  No reported recent extended travel outside the country.  Not on OCPs.  Denies any prior history of blood clots in the past.  Noted had multiple orthopedic surgeries to the right foot including bunionectomy, hammertoe surgery as well as tendon repair.  Non-smoker.  No reported alcohol use.   Hospital Course:  Patient is post-op from 4/30 cervical spine acdf. She presents with right leg swelling. Found to have DVT in that leg. No definite PE seen on CTA and no symptoms of PE. Given recent surgery neurosurgery was consulted, who in consultation with patient's recent surgeon dr. Tenna Fees and our vascular team determined that the best course of action would be ivc filter (which was placed), prophylactic chemical ppx (lovenox) until pod 14, with tentative plan to convert to oral anticoagulation at that time. This is first dvt and  appears to be provoked, though patient reports possible first degree family history of clotting disorder, so may merit further f/u of that and potentially w/u for thrombophilic disorder.   Procedures: IVC filter placement   Consultations: Neurosurgery, vascular surgery  Discharge Exam: Vitals:   02/16/24 1315 02/16/24 1423  BP: 130/74 (!) 108/57  Pulse: 80 84  Resp: 14 18  Temp:  97.9 F (36.6 C)  SpO2: 98% 98%    General: NAD, in neck brace Cardiovascular: rrr Respiratory: ctab Ext: mild right leg swelling  Discharge Instructions   Discharge Instructions     Diet - low sodium heart healthy   Complete by: As directed    Increase activity slowly   Complete by: As directed       Allergies as of 02/16/2024       Reactions   Diclofenac Palpitations, Other (See Comments)   RESMBLES AN HEART ATTACK Other Reaction(s): chest pain, irregular heart rate   Lactose Diarrhea, Other (See Comments)   Other reaction(s): Abdominal Pain Other Reaction(s): abdominal pain, other   Meloxicam  Palpitations   Gluten Meal Diarrhea   Also, - Chills, "sweats", weakness   Lactose Intolerance (gi) Other (See Comments)   Stomach cramps        Medication List     TAKE these medications    DSS 100 MG Caps Take 1 capsule by mouth 2 (two) times daily.   enoxaparin 60 MG/0.6ML injection Commonly known as: LOVENOX Inject 0.5 mLs (50 mg total) into the skin daily for 6 days.   HYDROcodone -acetaminophen  5-325 MG tablet Commonly known as: NORCO/VICODIN Take  1 tablet by mouth every 4 (four) hours.   metoprolol  succinate 50 MG 24 hr tablet Commonly known as: TOPROL -XL Take 1 tablet (50 mg total) by mouth every evening. Take with or immediately following a meal.   olopatadine 0.1 % ophthalmic solution Commonly known as: PATANOL Place 1 drop into both eyes 2 (two) times daily as needed for allergies.   ondansetron  4 MG disintegrating tablet Commonly known as: ZOFRAN -ODT Take 8 mg  by mouth every 8 (eight) hours as needed for nausea.   sodium chloride  0.65 % Soln nasal spray Commonly known as: OCEAN Place 1 spray into both nostrils 2 (two) times daily.       Allergies  Allergen Reactions   Diclofenac Palpitations and Other (See Comments)    RESMBLES AN HEART ATTACK  Other Reaction(s): chest pain, irregular heart rate   Lactose Diarrhea and Other (See Comments)    Other reaction(s): Abdominal Pain  Other Reaction(s): abdominal pain, other   Meloxicam  Palpitations   Gluten Meal Diarrhea    Also, - Chills, "sweats", weakness   Lactose Intolerance (Gi) Other (See Comments)    Stomach cramps    Follow-up Information     Rex Castor, MD Follow up.   Specialty: Internal Medicine Contact information: [address] [place] [address] [phone]         Prescilla Brod, Ninette Basque, MD Follow up.   Specialties: Vascular Surgery, Cardiology, Radiology, Vascular Surgery Why: 3 wks Contact information: [address] [place] [address] [phone]         Hattie Lints, MD Follow up.   Specialty: Orthopedic Surgery Why: as scheduled Contact information: [address] [place] [address] [phone]                  The results of significant diagnostics from this hospitalization (including imaging, microbiology, ancillary and laboratory) are listed below for reference.    Significant Diagnostic Studies: PERIPHERAL VASCULAR CATHETERIZATION Result Date: 02/16/2024 See surgical note for result.  CT Angio Chest Pulmonary Embolism (PE) W or WO Contrast Result Date: 02/15/2024 CLINICAL DATA:  Bilateral lower extremity swelling status post recent spinal surgery. History of deep venous thrombosis. EXAM: CT ANGIOGRAPHY CHEST WITH CONTRAST TECHNIQUE: Multidetector CT imaging of the chest was performed using the standard protocol during bolus administration of intravenous contrast. Multiplanar CT image  reconstructions and MIPs were obtained to evaluate the vascular anatomy. RADIATION DOSE REDUCTION: This exam was performed according to the departmental dose-optimization program which includes automated exposure control, adjustment of the mA and/or kV according to patient size and/or use of iterative reconstruction technique. CONTRAST:  75mL OMNIPAQUE IOHEXOL 350 MG/ML SOLN COMPARISON:  Duplex ultrasound of same day. FINDINGS: Cardiovascular: Mild cardiomegaly. No pericardial effusion. There is noted a small filling defect seen in the proximal portion of the left pulmonary artery which may represent possible scanning artifact, although small acute or chronic pulmonary embolus cannot be excluded. No other filling defect or pulmonary embolus is noted. Mediastinum/Nodes: No enlarged mediastinal, hilar, or axillary lymph nodes. Thyroid  gland, trachea, and esophagus demonstrate no significant findings. Lungs/Pleura: Lungs are clear. No pleural effusion or pneumothorax. Upper Abdomen: No acute abnormality. Musculoskeletal: No chest wall abnormality. No acute or significant osseous findings. Review of the MIP images confirms the above findings. IMPRESSION: There is noted a small nonocclusive filling defect within the lumen of the proximal portion of the left pulmonary artery. This may represent possible scanning artifact, but small acute or chronic pulmonary  embolus cannot be excluded given that the patient does have right lower extremity deep venous thrombosis based on exam today. Electronically Signed   By: Rosalene Colon M.D.   On: 02/15/2024 20:11   DG Chest 2 View Result Date: 02/15/2024 CLINICAL DATA:  Bilateral lower leg swelling status post recent spinal surgery. EXAM: CHEST - 2 VIEW COMPARISON:  July 24, 2023 FINDINGS: The heart size and mediastinal contours are within normal limits. Low lung volumes are noted. Both lungs are clear. Radiopaque surgical clips are seen within the right upper quadrant.  Postoperative changes are seen throughout the cervical spine. No acute osseous abnormalities are identified. IMPRESSION: No active cardiopulmonary disease. Electronically Signed   By: Virgle Grime M.D.   On: 02/15/2024 14:33   US  Venous Img Lower Bilateral Result Date: 02/15/2024 CLINICAL DATA:  Bilateral lower extremity swelling for 5 days. EXAM: BILATERAL LOWER EXTREMITY VENOUS DOPPLER ULTRASOUND TECHNIQUE: Gray-scale sonography with graded compression, as well as color Doppler and duplex ultrasound were performed to evaluate the lower extremity deep venous systems from the level of the common femoral vein and including the common femoral, femoral, profunda femoral, popliteal and calf veins including the posterior tibial, peroneal and gastrocnemius veins when visible. The superficial great saphenous vein was also interrogated. Spectral Doppler was utilized to evaluate flow at rest and with distal augmentation maneuvers in the common femoral, femoral and popliteal veins. COMPARISON:  None Available. FINDINGS: RIGHT LOWER EXTREMITY Common Femoral Vein: Partial compressibility is noted with some flow consistent with nonocclusive thrombus. Saphenofemoral Junction: Partial compressibility is noted consistent with nonocclusive thrombus. Profunda Femoral Vein: Partial compressibility consistent with nonocclusive thrombus. Femoral Vein: No evidence of thrombus. Normal compressibility, respiratory phasicity and response to augmentation. Popliteal Vein: No evidence of thrombus. Normal compressibility, respiratory phasicity and response to augmentation. Calf Veins: Noncompressible consistent with occlusive thrombus and posterior tibial veins. Peroneal vein is unremarkable. Superficial Great Saphenous Vein: No evidence of thrombus. Normal compressibility. Venous Reflux:  None. Other Findings: Probable Baker's cyst seen in right popliteal fossa. LEFT LOWER EXTREMITY Common Femoral Vein: No evidence of thrombus. Normal  compressibility, respiratory phasicity and response to augmentation. Saphenofemoral Junction: No evidence of thrombus. Normal compressibility and flow on color Doppler imaging. Profunda Femoral Vein: No evidence of thrombus. Normal compressibility and flow on color Doppler imaging. Femoral Vein: No evidence of thrombus. Normal compressibility, respiratory phasicity and response to augmentation. Popliteal Vein: No evidence of thrombus. Normal compressibility, respiratory phasicity and response to augmentation. Calf Veins: No evidence of thrombus. Normal compressibility and flow on color Doppler imaging. Superficial Great Saphenous Vein: No evidence of thrombus. Normal compressibility. Venous Reflux:  None. Other Findings:  None. IMPRESSION: Deep venous thrombosis is noted in the right common femoral, saphenofemoral junction, posterior tibial and profunda femoral veins. No evidence of deep venous thrombosis is noted in left lower extremity. Electronically Signed   By: Rosalene Colon M.D.   On: 02/15/2024 14:23    Microbiology: No results found for this or any previous visit (from the past 240 hours).   Labs: Basic Metabolic Panel: Recent Labs  Lab 02/15/24 0954 02/16/24 0512  NA 137 138  K 4.3 3.6  CL 106 105  CO2 23 24  GLUCOSE 104* 92  BUN 12 11  CREATININE 0.79 0.66  CALCIUM 9.1 9.1   Liver Function Tests: Recent Labs  Lab 02/15/24 0954 02/16/24 0512  AST 20 18  ALT 14 15  ALKPHOS 57 54  BILITOT 0.6 0.7  PROT 6.9 6.4*  ALBUMIN 4.0 3.8   No results for input(s): "LIPASE", "AMYLASE" in the last 168 hours. No results for input(s): "AMMONIA" in the last 168 hours. CBC: Recent Labs  Lab 02/15/24 0954 02/16/24 0512  WBC 5.9 6.1  NEUTROABS 4.2  --   HGB 14.2 13.4  HCT 42.6 40.8  MCV 90.3 91.3  PLT 179 170   Cardiac Enzymes: No results for input(s): "CKTOTAL", "CKMB", "CKMBINDEX", "TROPONINI" in the last 168 hours. BNP: BNP (last 3 results) Recent Labs    02/15/24 0953   BNP 3.5    ProBNP (last 3 results) No results for input(s): "PROBNP" in the last 8760 hours.  CBG: No results for input(s): "GLUCAP" in the last 168 hours.     Signed:  Raymonde Calico MD.  [facility] 02/16/2024, 5:44 PM

## 2024-02-16 NOTE — Progress Notes (Signed)
 Pt admitted to room 205-B today at 1415 after IVC filter placement. Right groin site is soft and dressing is C,D,I. C-collar in place. Husband at bedside. All admission questions were previously answered. Assessment completed. Reporting chronic pain in left shoulder of 6/10. Call bell within reach.

## 2024-02-16 NOTE — ED Notes (Signed)
 Pt placed in hospital gown. Belongings in bag and sent with pt to OR.

## 2024-02-16 NOTE — Care Management Obs Status (Signed)
 MEDICARE OBSERVATION STATUS NOTIFICATION   Patient Details  Name: Tammy Vang MRN: [id number] Date of Birth: 05-Sep-1956   Medicare Observation Status Notification Given:  Yes    Anise Kerns 02/16/2024, 3:57 PM

## 2024-02-16 NOTE — Progress Notes (Signed)
  Progress Note    02/16/2024 1:20 PM * Day of Surgery *  Subjective:  Tammy Vang is a 68 yo female now POD #1 from Inferior vena cava filter placement.  Patient is resting comfortably in bed this afternoon.  Right groin incision site is clean dry and intact.  No hematoma seroma noted.  Patient denies any other complaints.  She notes that her leg still remains swollen.  I had a long discussion with her this afternoon regarding her leg continuing to be swollen without the use of any anticoagulation due to her prior neck surgery.  Patient's husband is at the bedside he verbalizes understanding.  Vitals all remained stable.  Review of Systems  Constitutional:  Constitutional negative. HENT: HENT negative.  Respiratory: Respiratory negative.  Cardiovascular: Cardiovascular negative.  GI: Gastrointestinal negative.  GU: Genitourinary negative. Skin: Skin negative.  Neurological: Neurological negative. Psychiatric: Psychiatric negative.  All other systems reviewed and are negative     Vitals:   02/16/24 1245 02/16/24 1300  BP: 132/77 114/74  Pulse: 87 78  Resp: 15 14  Temp:    SpO2: 98% 97%   Physical Exam: Cardiac:  RRR, normal S1 and S2.  No rubs clicks gallops or murmurs noted. Lungs: Clear throughout on auscultation.  No rales rhonchi or wheezing. Incisions: Right groin puncture site with dressing clean dry and intact.  No hematoma seroma to note. Extremities: All extremities warm to touch with palpable pulses except right lower leg which has +2 edema.  Unable to palpate pulses in right lower extremity. Abdomen: Positive bowel sounds throughout, soft, nontender nondistended. Neurologic: Alert and oriented x 3, answers all questions and follows commands appropriately.  CBC    Component Value Date/Time   WBC 6.1 02/16/2024 0512   RBC 4.47 02/16/2024 0512   HGB 13.4 02/16/2024 0512   HCT 40.8 02/16/2024 0512   PLT 170 02/16/2024 0512   MCV 91.3 02/16/2024 0512   MCH 30.0  02/16/2024 0512   MCHC 32.8 02/16/2024 0512   RDW 13.0 02/16/2024 0512   LYMPHSABS 1.1 02/15/2024 0954   MONOABS 0.3 02/15/2024 0954   EOSABS 0.2 02/15/2024 0954   BASOSABS 0.1 02/15/2024 0954    BMET    Component Value Date/Time   NA 138 02/16/2024 0512   K 3.6 02/16/2024 0512   CL 105 02/16/2024 0512   CO2 24 02/16/2024 0512   GLUCOSE 92 02/16/2024 0512   BUN 11 02/16/2024 0512   CREATININE 0.66 02/16/2024 0512   CALCIUM 9.1 02/16/2024 0512   GFRNONAA >60 02/16/2024 0512    INR No results found for: "INR"  No intake or output data in the 24 hours ending 02/16/24 1320   Assessment/Plan:  68 y.o. female is s/p inferior vena cava filter placement.* Day of Surgery *   Plan Okay to discharge patient home today on Lovenox as previously ordered. Patient to follow-up with vein and vascular surgery in 3 weeks with Dr. Barry Lies.  No studies will be needed at this time.  DVT prophylaxis: Lovenox 50 mg subcu once daily.   Annamaria Barrette [facility] 02/16/2024 1:20 PM

## 2024-02-16 NOTE — Interval H&P Note (Signed)
 History and Physical Interval Note:  02/16/2024 9:10 AM  Tammy Vang  has presented today for surgery, with the diagnosis of Right Lower DVT Unable to anticoagulate..  The various methods of treatment have been discussed with the patient and family. After consideration of risks, benefits and other options for treatment, the patient has consented to  Procedure(s): IVC FILTER INSERTION (N/A) as a surgical intervention.  The patient's history has been reviewed, patient examined, no change in status, stable for surgery.  I have reviewed the patient's chart and labs.  Questions were answered to the patient's satisfaction.     Devon Fogo

## 2024-02-16 NOTE — Progress Notes (Signed)
 PT Cancellation Note  Patient Details Name: Tammy Vang MRN: [id number] DOB: [birth year]   Cancelled Treatment:    Reason Eval/Treat Not Completed: Other (comment).  Chart reviewed and pt is currently off the flor having IVC filter insertion according to notes.  Will re-attempt at later date/time as medically appropriate.   Rozanna Corner, PT, DPT Physical Therapist - [facility]  02/16/24, 2:07 PM

## 2024-02-16 NOTE — Progress Notes (Signed)
 OT Cancellation Note  Patient Details Name: Tammy Vang MRN: [id number] DOB: [birth year]   Cancelled Treatment:    Reason Eval/Treat Not Completed: Patient at procedure or test/ unavailable. Patient off unit in operation this AM, OT will re-attempt when medically appropriate.   Rosaria Common M.S. OTR/L  02/16/24, 8:45 AM

## 2024-02-28 ENCOUNTER — Encounter (INDEPENDENT_AMBULATORY_CARE_PROVIDER_SITE_OTHER): Payer: Self-pay

## 2024-03-12 ENCOUNTER — Encounter (INDEPENDENT_AMBULATORY_CARE_PROVIDER_SITE_OTHER): Admitting: Vascular Surgery

## 2024-03-19 DIAGNOSIS — M5412 Radiculopathy, cervical region: Secondary | ICD-10-CM | POA: Insufficient documentation

## 2024-03-21 ENCOUNTER — Ambulatory Visit
Admission: RE | Admit: 2024-03-21 | Discharge: 2024-03-21 | Disposition: A | Discharge status: Home / Self Care | Source: Ambulatory Visit

## 2024-03-21 VITALS — BP 122/84 | HR 70 | Temp 97.9°F | Resp 16

## 2024-03-21 DIAGNOSIS — H6993 Unspecified Eustachian tube disorder, bilateral: Secondary | ICD-10-CM | POA: Diagnosis not present

## 2024-03-21 MED ORDER — CIPROFLOXACIN-DEXAMETHASONE 0.3-0.1 % OT SUSP: 4.0000 [drp] | Freq: Two times a day (BID) | OTIC | 0 refills | Status: DC

## 2024-03-21 NOTE — Discharge Instructions (Signed)
 Your evaluated for your ear pain, on exam there are no overt signs of infection such as redness, pus or swelling, able to see fluid sitting behind the ear which is typically sinus related  Empirically placed on Ciprodex which is an antibiotic and steroid as you have had symptoms for 2 weeks, Place 4 drops into the ears every morning and every evening for 7 days  Recommend that you take a medicine for congestion such as Coricidin or nasal spray to further assist and reduce symptoms  May continue use of Tylenol  as needed for pain as well as hold warm compresses to the external ear  If you continue to have symptoms please follow-up for reevaluation

## 2024-03-21 NOTE — ED Provider Notes (Signed)
 Tammy Vang    CSN: [id number] Arrival date & time: 03/21/24  1251      History   Chief Complaint Chief Complaint  Patient presents with   Otalgia    HPI Tammy Vang is a 68 y.o. female.   Patient presents for evaluation of bilateral ear pain and itching present for 1-1/2 to 2 weeks.  Feels like a sensation of fluid within the ear but denies decreased hearing.  Denies fever or congestion.  Has attempted use of Tylenol .  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 pop/saph block   CHOLECYSTECTOMY     COLONOSCOPY     DILATION AND CURETTAGE OF UTERUS     FLAT FOOT CORRECTION Right 01/22/2020   Procedure: EVANS/MCDO;  Surgeon: Pink Bridges, DPM;  Location: [facility];  Service: Podiatry;  Laterality: Right;   HAMMER TOE SURGERY Right 01/22/2020   Procedure: HAMMER TOE CORRECTION;  Surgeon: Pink Bridges, DPM;  Location: [facility];  Service: Podiatry;  Laterality: Right;   IVC FILTER INSERTION N/A 02/16/2024   Procedure: IVC FILTER INSERTION;  Surgeon: Jackquelyn Mass, MD;  Location: [facility] INVASIVE CV LAB;  Service: Cardiovascular;  Laterality: N/A;   TENDON REPAIR Right 10/24/2020   Procedure: FLEXOR TENDON REPAIR SECONDARY RIGHT; NONUNION REPAIR SECONDARDY RIGHT;  Surgeon: Anell Baptist, DPM;  Location: [facility] ORS;  Service: Podiatry;  Laterality: Right;    OB History   No obstetric history on file.      Home Medications    Prior to Admission medications   Medication Sig Start Date End Date Taking? Authorizing Provider  apixaban  (ELIQUIS ) 5 MG TABS tablet Take 1 tablet (5 mg total) by mouth every 12 (twelve) hours 03/19/24  Yes [provider]  ciprofloxacin-dexamethasone  (CIPRODEX) OTIC suspension Place 4 drops into both ears 2 (two) times daily. 03/21/24  Yes Que Meneely R, NP  Docusate Sodium  (DSS) 100 MG CAPS Take 1 capsule by mouth 2 (two) times daily.   Yes [provider]  enoxaparin  (LOVENOX ) 60 MG/0.6ML injection Inject 0.5 mLs (50 mg total) into the skin daily for 6 days. 02/16/24 03/21/24 Yes Wouk, Haynes Lips, MD  HYDROcodone -acetaminophen  (NORCO/VICODIN) 5-325 MG tablet Take 1 tablet by mouth every 4 (four) hours.   Yes [provider]  metoprolol  succinate (TOPROL -XL) 50 MG 24 hr tablet Take 1 tablet (50 mg total) by mouth every evening. Take with or immediately following a meal.  10/21/23  Yes Ardeen Kohler, MD  olopatadine (PATANOL) 0.1 % ophthalmic solution Place 1 drop into both eyes 2 (two) times daily as needed for allergies.   Yes [provider]  ondansetron  (ZOFRAN -ODT) 4 MG disintegrating tablet Take 8 mg by mouth every 8 (eight) hours as needed for nausea.   Yes [provider]  sodium chloride  (OCEAN) 0.65 % SOLN nasal spray Place 1 spray into both nostrils 2 (two) times daily.   Yes [provider]    Family History Family History   Problem Relation Age of Onset   Breast cancer Neg Hx     Social History Social History   Tobacco Use   Smoking status: Never   Smokeless tobacco: Never  Vaping Use   Vaping status: Never Used  Substance Use Topics   Alcohol use: Not Currently   Drug use: Never     Allergies   Diclofenac, Lactose, Meloxicam , Gluten meal, and Lactose intolerance (gi)   Review of Systems Review of Systems   Physical Exam Triage Vital Signs ED Triage Vitals  Encounter Vitals Group     BP 03/21/24 1255 122/84     Systolic BP Percentile --      Diastolic BP Percentile --      Pulse Rate 03/21/24 1255 70     Resp 03/21/24 1255 16     Temp 03/21/24 1255 97.9 F (36.6 C)     Temp Source 03/21/24 1255 Oral     SpO2 03/21/24 1255 97 %     Weight --      Height --      Head Circumference --      Peak Flow --      Pain Score 03/21/24 1303 5     Pain Loc --      Pain Education --      Exclude from Growth Chart --    No data found.  Updated Vital Signs BP 122/84 (BP Location: Left Arm)   Pulse 70   Temp 97.9 F (36.6 C) (Oral)   Resp 16   SpO2 97%   Visual Acuity Right Eye Distance:   Left Eye Distance:   Bilateral Distance:    Right Eye Near:   Left Eye Near:    Bilateral Near:     Physical Exam Constitutional:      Appearance: Normal appearance.  HENT:     Right Ear: A middle ear effusion is present.     Left Ear: A middle ear effusion is present.  Eyes:     Extraocular Movements: Extraocular movements intact.  Pulmonary:     Effort: Pulmonary effort is normal.  Neurological:     Mental Status: She is alert and oriented to person, place, and time.      UC Treatments / Results  Labs (all labs ordered are listed, but only abnormal results are displayed) Labs Reviewed - No data to display  EKG   Radiology No results found.  Procedures Procedures (including critical care time)  Medications Ordered in UC Medications - No data to display  Initial  Impression / Assessment and Plan / UC Course  I have reviewed the triage vital signs and the nursing notes.  Pertinent labs & imaging results that were available during my care of the patient were reviewed by me and considered in my medical decision making (see chart for details).  Bilateral eustachian tube dysfunction  No overt signs of infection, empirically placed on Ciprodex as symptoms have been  present for 2 weeks, middle ear effusion noted on exam, discussed findings with patient, recommended supportive care through over-the-counter analgesics, decongestants etc., advised follow-up if symptoms continue to persist or worsen Final Clinical Impressions(s) / UC Diagnoses   Final diagnoses:  Eustachian tube dysfunction, bilateral   Discharge Instructions      Your evaluated for your ear pain, on exam there are no overt signs of infection such as redness, pus or swelling, able to see fluid sitting behind the ear which is typically sinus related  Empirically placed on Ciprodex which is an antibiotic and steroid as you have had symptoms for 2 weeks, Place 4 drops into the ears every morning and every evening for 7 days  Recommend that you take a medicine for congestion such as Coricidin or nasal spray to further assist and reduce symptoms  May continue use of Tylenol  as needed for pain as well as hold warm compresses to the external ear  If you continue to have symptoms please follow-up for reevaluation  ED Prescriptions     Medication Sig Dispense Auth. Provider   ciprofloxacin-dexamethasone  (CIPRODEX) OTIC suspension Place 4 drops into both ears 2 (two) times daily. 7.5 mL Reena Canning, NP      PDMP not reviewed this encounter.   Reena Canning, NP 03/21/24 1331

## 2024-03-21 NOTE — ED Triage Notes (Signed)
 Pt c/o L ear pain & itchiness L>R x2 wks. Had recent neck surgery 6 wks ago.

## 2024-03-29 ENCOUNTER — Other Ambulatory Visit (INDEPENDENT_AMBULATORY_CARE_PROVIDER_SITE_OTHER): Payer: Self-pay | Admitting: Vascular Surgery

## 2024-03-29 DIAGNOSIS — I82411 Acute embolism and thrombosis of right femoral vein: Secondary | ICD-10-CM

## 2024-03-31 NOTE — Progress Notes (Signed)
 MRN : [id number]  Tammy Vang is a 68 y.o. (1956/08/11) female who presents with chief complaint of follow up IVC filter   History of Present Illness:   The patient returns for follow-up regarding placement of an IVC filter.  She has a medical history significant of cervical radiculopathy with recent cervical spine surgery last week presenting with DVT.  Patient noted had cervical spine surgery with Dr. Frederic Don at the [facility] in [place].  Patient reports decreased mobility at home.  Worsening right lower extremity swelling and pain over similar timeframe.  No reported trauma.  No reported recent extended travel outside the country.  Not on OCPs.  Denies any prior history of blood clots in the past.  Noted had multiple orthopedic surgeries to the right foot including bunionectomy, hammertoe surgery as well as tendon repair.     Presented to the ER 02/15/2024 afebrile, hemodynamically stable.  Satting well on room air.  White count 5.9, hemoglobin 14.2, platelets 179, creatinine 0.79.  Lower extremity ultrasound noted with deep venous thrombosis is noted in the right common femoral, saphenofemoral junction, posterior tibial and profunda femoral Veins.  Patient does not complain of pain to her right lower extremity but is very much bothered by the swelling.  [place] filter was placed Feb 16, 2024  She has started Eliquis  at full dose.  Since placement of the filter she has not noted any major changes in her lower extremities.  She denies severe swelling of the right lower extremity.  No leg pain.  Duplex ultrasound of the venous system right lower extremity demonstrates full compressibility no evidence of chronic DVT no evidence of acute changes.  There appears to have been resolution of her thrombus.  No outpatient medications have been marked as taking for the 04/02/24 encounter (Appointment) with Jama, Cordella MATSU, MD.    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   Arthritis    knees   PONV (postoperative nausea and vomiting)    after D&C and Cholecystectomy   SVT (supraventricular tachycardia) (HCC)     Past Surgical History:  Procedure Laterality Date   BUNIONECTOMY Right    BUNIONECTOMY Right 01/22/2020   Procedure: BUNIONECTOMY LAPIDUS TYPE + AKIN RIGHT;  Surgeon: Lennie Barter, DPM;  Location: [facility];  Service: Podiatry;  Laterality: Right;  pre op anes. pop/saph block   CHOLECYSTECTOMY     COLONOSCOPY     DILATION AND CURETTAGE OF UTERUS     FLAT FOOT CORRECTION Right 01/22/2020   Procedure: EVANS/MCDO;  Surgeon: Lennie Barter, DPM;  Location: [facility];  Service: Podiatry;  Laterality: Right;   HAMMER TOE SURGERY Right 01/22/2020   Procedure: HAMMER TOE CORRECTION;  Surgeon: Lennie Barter, DPM;  Location: [facility];  Service: Podiatry;  Laterality: Right;   IVC FILTER INSERTION N/A 02/16/2024   Procedure: IVC FILTER INSERTION;  Surgeon: Jama Cordella MATSU, MD;  Location: [facility] INVASIVE CV LAB;  Service: Cardiovascular;  Laterality: N/A;   TENDON REPAIR Right 10/24/2020   Procedure: FLEXOR TENDON REPAIR SECONDARY RIGHT; NONUNION REPAIR SECONDARDY RIGHT;  Surgeon: Ashley Soulier, DPM;  Location: [facility] ORS;  Service: Podiatry;  Laterality: Right;    Social History Social History   Tobacco Use   Smoking status: Never   Smokeless tobacco: Never  Vaping Use   Vaping status:  Never Used  Substance Use Topics   Alcohol use: Not Currently   Drug use: Never    Family History Family History  Problem Relation Age of Onset   Breast cancer Neg Hx     Allergies  Allergen Reactions   Diclofenac Palpitations and Other (See Comments)    RESMBLES AN HEART ATTACK  Other Reaction(s): chest pain, irregular heart rate   Lactose Diarrhea and Other (See Comments)    Other reaction(s): Abdominal Pain  Other  Reaction(s): abdominal pain, other   Meloxicam  Palpitations   Gluten Meal Diarrhea    Also, - Chills, sweats, weakness   Lactose Intolerance (Gi) Other (See Comments)    Stomach cramps     REVIEW OF SYSTEMS (Negative unless checked)  Constitutional: [] Weight loss  [] Fever  [] Chills Cardiac: [] Chest pain   [] Chest pressure   [] Palpitations   [] Shortness of breath when laying flat   [] Shortness of breath with exertion. Vascular:  [] Pain in legs with walking   [x] Pain in legs at rest  [] History of DVT   [] Phlebitis   [x] Swelling in legs   [] Varicose veins   [] Non-healing ulcers Pulmonary:   [] Uses home oxygen   [] Productive cough   [] Hemoptysis   [] Wheeze  [] COPD   [] Asthma Neurologic:  [] Dizziness   [] Seizures   [] History of stroke   [] History of TIA  [] Aphasia   [] Vissual changes   [] Weakness or numbness in arm   [] Weakness or numbness in leg Musculoskeletal:   [] Joint swelling   [] Joint pain   [] Low back pain Hematologic:  [] Easy bruising  [] Easy bleeding   [] Hypercoagulable state   [] Anemic Gastrointestinal:  [] Diarrhea   [] Vomiting  [] Gastroesophageal reflux/heartburn   [] Difficulty swallowing. Genitourinary:  [] Chronic kidney disease   [] Difficult urination  [] Frequent urination   [] Blood in urine Skin:  [] Rashes   [] Ulcers  Psychological:  [] History of anxiety   []  History of major depression.  Physical Examination  There were no vitals filed for this visit. There is no height or weight on file to calculate BMI. Gen: WD/WN, NAD Head: [place]/AT, No temporalis wasting.  Ear/Nose/Throat: Hearing grossly intact, nares w/o erythema or drainage, pinna without lesions Eyes: PER, EOMI, sclera nonicteric.  Neck: Supple, no gross masses.  No JVD.  Pulmonary:  Good air movement, no audible wheezing, no use of accessory muscles.  Cardiac: RRR, precordium not hyperdynamic. Vascular:  scattered varicosities present bilaterally.  Mild venous stasis changes to the legs bilaterally.  Trace soft  pitting edema. CEAP [id number]   Vessel Right Left  Radial Palpable Palpable  Gastrointestinal: soft, non-distended. No guarding/no peritoneal signs.  Musculoskeletal: M/S 5/5 throughout.  No deformity.  Neurologic: CN 2-12 intact. Pain and light touch intact in extremities.  Symmetrical.  Speech is fluent. Motor exam as listed above. Psychiatric: Judgment intact, Mood & affect appropriate for pt's clinical situation. Dermatologic: Venous rashes no ulcers noted.  No changes consistent with cellulitis. Lymph : No lichenification or skin changes of chronic lymphedema.  CBC Lab Results  Component Value Date   WBC 6.1 02/16/2024   HGB 13.4 02/16/2024   HCT 40.8 02/16/2024   MCV 91.3 02/16/2024   PLT 170 02/16/2024    BMET    Component Value Date/Time   NA 138 02/16/2024 0512   K 3.6 02/16/2024 0512   CL 105 02/16/2024 0512   CO2 24 02/16/2024 0512   GLUCOSE 92 02/16/2024 0512   BUN 11 02/16/2024 0512   CREATININE 0.66 02/16/2024 0512   CALCIUM  9.1 02/16/2024 0512   GFRNONAA >60 02/16/2024 0512   CrCl cannot be calculated (Patient's most recent lab result is older than the maximum 21 days allowed.).  COAG No results found for: INR, PROTIME  Radiology No results found.   Assessment/Plan 1. Deep vein thrombosis (DVT) of proximal lower extremity, unspecified chronicity, unspecified laterality (HCC) (Primary) Currently patient is doing well with her anticoagulation.  She will follow-up with me in 6 weeks at which time we will discuss filter removal.    Cordella Shawl, MD  03/31/2024 3:48 PM

## 2024-04-02 ENCOUNTER — Ambulatory Visit (INDEPENDENT_AMBULATORY_CARE_PROVIDER_SITE_OTHER)

## 2024-04-02 ENCOUNTER — Ambulatory Visit (INDEPENDENT_AMBULATORY_CARE_PROVIDER_SITE_OTHER): Admitting: Vascular Surgery

## 2024-04-02 VITALS — BP 121/81 | HR 80 | Ht 68.0 in | Wt 218.4 lb

## 2024-04-02 DIAGNOSIS — I824Y9 Acute embolism and thrombosis of unspecified deep veins of unspecified proximal lower extremity: Secondary | ICD-10-CM

## 2024-04-02 DIAGNOSIS — I82411 Acute embolism and thrombosis of right femoral vein: Secondary | ICD-10-CM | POA: Diagnosis not present

## 2024-04-05 DIAGNOSIS — M542 Cervicalgia: Secondary | ICD-10-CM | POA: Insufficient documentation

## 2024-04-05 DIAGNOSIS — M25512 Pain in left shoulder: Secondary | ICD-10-CM | POA: Insufficient documentation

## 2024-04-08 ENCOUNTER — Encounter (INDEPENDENT_AMBULATORY_CARE_PROVIDER_SITE_OTHER): Payer: Self-pay | Admitting: Vascular Surgery

## 2024-04-16 NOTE — Progress Notes (Unsigned)
 Cardiology Office Note:  .   Date:  04/16/2024  ID:  Tammy Vang, DOB 13-Jan-1956, MRN [id number] PCP: Sherial Bail, MD  [facility] Providers Cardiologist:  None Electrophysiologist:  Fonda Kitty, MD {  History of Present Illness: Tammy   [name] is a 68 y.o. female w/PMHx of  SVT (adenosine sensitive)  Referred to Dr. Kitty for her SVT, saw him Jan 2025, some fatigue with the morning BB dose especially, discussed a ZIO monitor for 14 days, which showed only 4 nonsustained SVT episodes with the longest lasting 16.7 seconds. Reported occ fluttering Discussed management options, she preferred to avoid invasive procedures, her Toprol  25mg  BID >> 50mg  at HS Might consider CCB if fatigue doesn't improve. Planned for 518mo  She saw CANDIE Ferrier, PA-C 01/13/24, limited by orthopedic issues with her ankle (s), had palpitations with COVID illness Felt acceptable candidate for C-spine surgery planned  Admitted 02/15/24 with RLE DVT (7 days s/p C-spine surgery) IVCF placed 02/16/24 Discharged 02/16/24 on lovenox  until POD #14 with some discussion +/- OAC after that  She saw [facility]  Dr Jaqueline 04/02/24, was on Eliquis , full dose, denied LE complaints, US  this visit was neg for DVT RLE, planned to see her back in 6 weeks and consider removal of the IVCF   Today's visit is scheduled as a 518mo visit ROS:   She is accompanied by her husband. She is doing pretty well She had one brief SVT post op that was quickly stopped by vagal amneuver, none otherwise. No SOB, CP  She is wearing a bone growth stimulator (4hr/day) on her neck She reports vascular team has not decided about taking the IVCF out given she may require shoulder surgery in the future    Arrhythmia/AAD hx SVT found Sept 2024  Studies Reviewed: Tammy    EKG not done today  Echo 09/07/23:  1. Left ventricular ejection fraction, by estimation, is 55 to 60%. The  left ventricle has normal function. The left ventricle has no  regional  wall motion abnormalities. Left ventricular diastolic parameters were  normal. The average left ventricular  global longitudinal strain is -18.9 %. The global longitudinal strain is  normal.   2. Right ventricular systolic function is normal. The right ventricular  size is normal.   3. Left atrial size was mildly dilated.   4. The mitral valve is abnormal. No evidence of mitral valve  regurgitation. No evidence of mitral stenosis.   5. The aortic valve is tricuspid. There is mild calcification of the  aortic valve. Aortic valve regurgitation is not visualized. Aortic valve  sclerosis is present, with no evidence of aortic valve stenosis.   6. The inferior vena cava is normal in size with greater than 50%  respiratory variability, suggesting right atrial pressure of 3 mmHg.      Risk Assessment/Calculations:    Physical Exam:   VS:  There were no vitals taken for this visit.   Wt Readings from Last 3 Encounters:  04/02/24 218 lb 6.4 oz (99.1 kg)  02/16/24 220 lb 0.3 oz (99.8 kg)  01/13/24 217 lb (98.4 kg)    GEN: Well nourished, well developed in no acute distress NECK: No JVD; No carotid bruits CARDIAC: RRR, no murmurs, rubs, gallops RESPIRATORY:  CTA b/l without rales, wheezing or rhonchi  ABDOMEN: Soft, non-tender, non-distended EXTREMITIES: No edema; No deformity    ASSESSMENT AND PLAN: .    SVT Adenosine and vagal sensitive She has wanted to avoid  ablation  Maintained on BB Well managed   Dispo: back in 6 mo, sooner if needed  Signed, Charlies Macario Arthur, PA-C

## 2024-04-19 ENCOUNTER — Ambulatory Visit: Attending: Physician Assistant | Admitting: Physician Assistant

## 2024-04-19 ENCOUNTER — Encounter: Payer: Self-pay | Admitting: Physician Assistant

## 2024-04-19 VITALS — BP 112/68 | HR 74 | Ht 67.5 in | Wt 223.0 lb

## 2024-04-19 DIAGNOSIS — I471 Supraventricular tachycardia, unspecified: Secondary | ICD-10-CM | POA: Insufficient documentation

## 2024-04-19 NOTE — Patient Instructions (Signed)
 Medication Instructions:  NONE *If you need a refill on your cardiac medications before your next appointment, please call your pharmacy*  Lab Work: NONE If you have labs (blood work) drawn today and your tests are completely normal, you will receive your results only by: MyChart Message (if you have MyChart) OR A paper copy in the mail If you have any lab test that is abnormal or we need to change your treatment, we will call you to review the results.  Testing/Procedures: NONE  Follow-Up: At [facility], you and your health needs are our priority.  As part of our continuing mission to provide you with exceptional heart care, our providers are all part of one team.  This team includes your primary Cardiologist (physician) and Advanced Practice Providers or APPs (Physician Assistants and Nurse Practitioners) who all work together to provide you with the care you need, when you need it.  Your next appointment:   6 month(s)  Provider:   You may see Fonda Kitty, MD or one of the following Advanced Practice Providers on your designated Care Team:   Charlies Arthur, PA-C Michael Andy Tillery, PA-C Suzann Riddle, NP Daphne Barrack, NP   We recommend signing up for the patient portal called MyChart.  Sign up information is provided on this After Visit Summary.  MyChart is used to connect with patients for Virtual Visits (Telemedicine).  Patients are able to view lab/test results, encounter notes, upcoming appointments, etc.  Non-urgent messages can be sent to your provider as well.   To learn more about what you can do with MyChart, go to [email or web address].   Other Instructions NONE

## 2024-05-01 ENCOUNTER — Encounter: Payer: Self-pay | Admitting: Dermatology

## 2024-05-01 ENCOUNTER — Ambulatory Visit: Admitting: Dermatology

## 2024-05-01 DIAGNOSIS — S60561A Insect bite (nonvenomous) of right hand, initial encounter: Secondary | ICD-10-CM | POA: Diagnosis not present

## 2024-05-01 DIAGNOSIS — M545 Low back pain, unspecified: Secondary | ICD-10-CM | POA: Insufficient documentation

## 2024-05-01 DIAGNOSIS — W57XXXA Bitten or stung by nonvenomous insect and other nonvenomous arthropods, initial encounter: Secondary | ICD-10-CM

## 2024-05-01 DIAGNOSIS — L603 Nail dystrophy: Secondary | ICD-10-CM

## 2024-05-01 DIAGNOSIS — S60562A Insect bite (nonvenomous) of left hand, initial encounter: Secondary | ICD-10-CM

## 2024-05-01 DIAGNOSIS — L608 Other nail disorders: Secondary | ICD-10-CM | POA: Diagnosis not present

## 2024-05-01 DIAGNOSIS — B351 Tinea unguium: Secondary | ICD-10-CM | POA: Diagnosis not present

## 2024-05-01 MED ORDER — CLOBETASOL PROPIONATE 0.05 % EX CREA: 1.0000 | TOPICAL_CREAM | Freq: Two times a day (BID) | CUTANEOUS | 0 refills | Status: DC

## 2024-05-01 MED ORDER — CICLOPIROX 8 % EX SOLN: Freq: Every day | CUTANEOUS | 11 refills | Status: DC

## 2024-05-01 NOTE — Patient Instructions (Addendum)
 Treatment Plan for insect bites: Start clobetasol  0.05% cr twice daily as needed for itch. Avoid applying to face, groin, and axilla. Use as directed. Long-term use can cause thinning of the skin.  Topical steroids (such as triamcinolone, fluocinolone, fluocinonide, mometasone, clobetasol , halobetasol, betamethasone , hydrocortisone) can cause thinning and lightening of the skin if they are used for too long in the same area. Your physician has selected the right strength medicine for your problem and area affected on the body. Please use your medication only as directed by your physician to prevent side effects.    Due to recent changes in healthcare laws, you may see results of your pathology and/or laboratory studies on MyChart before the doctors have had a chance to review them. We understand that in some cases there may be results that are confusing or concerning to you. Please understand that not all results are received at the same time and often the doctors may need to interpret multiple results in order to provide you with the best plan of care or course of treatment. Therefore, we ask that you please give us  2 business days to thoroughly review all your results before contacting the office for clarification. Should we see a critical lab result, you will be contacted sooner.   If You Need Anything After Your Visit  If you have any questions or concerns for your doctor, please call our main line at [phone] and press option 4 to reach your doctor's medical assistant. If no one answers, please leave a voicemail as directed and we will return your call as soon as possible. Messages left after 4 pm will be answered the following business day.   You may also send us  a message via MyChart. We typically respond to MyChart messages within 1-2 business days.  For prescription refills, please ask your pharmacy to contact our office. Our fax number is [phone].  If you have an urgent issue when  the clinic is closed that cannot wait until the next business day, you can page your doctor at the number below.    Please note that while we do our best to be available for urgent issues outside of office hours, we are not available 24/7.   If you have an urgent issue and are unable to reach us , you may choose to seek medical care at your doctor's office, retail clinic, urgent care center, or emergency room.  If you have a medical emergency, please immediately call 911 or go to the emergency department.  Pager Numbers  - Dr. Hester: [phone]  - Dr. Jackquline: [phone]  - Dr. Claudene: [phone]   In the event of inclement weather, please call our main line at [phone] for an update on the status of any delays or closures.  Dermatology Medication Tips: Please keep the boxes that topical medications come in in order to help keep track of the instructions about where and how to use these. Pharmacies typically print the medication instructions only on the boxes and not directly on the medication tubes.   If your medication is too expensive, please contact our office at [phone] option 4 or send us  a message through MyChart.   We are unable to tell what your co-pay for medications will be in advance as this is different depending on your insurance coverage. However, we may be able to find a substitute medication at lower cost or fill out paperwork to get insurance to cover a needed medication.   If a prior authorization is  required to get your medication covered by your insurance company, please allow us  1-2 business days to complete this process.  Drug prices often vary depending on where the prescription is filled and some pharmacies may offer cheaper prices.  The website www.goodrx.com contains coupons for medications through different pharmacies. The prices here do not account for what the cost may be with help from insurance (it may be cheaper with your insurance), but  the website can give you the price if you did not use any insurance.  - You can print the associated coupon and take it with your prescription to the pharmacy.  - You may also stop by our office during regular business hours and pick up a GoodRx coupon card.  - If you need your prescription sent electronically to a different pharmacy, notify our office through [facility] or by phone at [phone] option 4.     Si Usted Necesita Algo Despus de Su Visita  Tambin puede enviarnos un mensaje a travs de Clinical cytogeneticist. Por lo general respondemos a los mensajes de MyChart en el transcurso de 1 a 2 das hbiles.  Para renovar recetas, por favor pida a su farmacia que se ponga en contacto con nuestra oficina. Randi lakes de fax es [place] [phone].  Si tiene un asunto urgente cuando la clnica est cerrada y que no puede esperar hasta el siguiente da hbil, puede llamar/localizar a su doctor(a) al nmero que aparece a continuacin.   Por favor, tenga en cuenta que aunque hacemos todo lo posible para estar disponibles para asuntos urgentes fuera del horario de [place], no estamos disponibles las 24 horas del da, los 7 [address]  [address] de la [place].   Si tiene un problema urgente y no puede comunicarse con nosotros, puede optar por buscar atencin mdica  en el consultorio de su doctor(a), en una clnica privada, en un centro de atencin urgente o en una sala de emergencias.  Si tiene Engineer, drilling, por favor llame inmediatamente al 911 o vaya a la sala de emergencias.  Nmeros de bper  - Dr. Hester: [phone]  - Dra. Jackquline: [phone]  - Dr. Claudene: [phone]   En caso de inclemencias del tiempo, por favor llame a landry capes principal al [phone] para una actualizacin sobre el [place] de cualquier retraso o cierre.  Consejos para la medicacin en dermatologa: Por favor, guarde las cajas en las que vienen los medicamentos de uso tpico para ayudarle a seguir las  instrucciones sobre dnde y cmo usarlos. Las farmacias generalmente imprimen las instrucciones del medicamento slo en las cajas y no directamente en los tubos del [place].   Si su medicamento es muy caro, por favor, pngase en contacto con landry rieger llamando al [phone] y presione la opcin 4 o envenos un mensaje a travs de Clinical cytogeneticist.   No podemos decirle cul ser su copago por los medicamentos por adelantado ya que esto es diferente dependiendo de la cobertura de su seguro. Sin embargo, es posible que podamos encontrar un medicamento sustituto a Audiological scientist un formulario para que el seguro cubra el medicamento que se considera necesario.   Si se requiere una autorizacin previa para que su compaa de seguros malta su medicamento, por favor permtanos de 1 a 2 das hbiles para completar este proceso.  Los precios de los medicamentos varan con frecuencia dependiendo del Environmental consultant de dnde se surte la receta y alguna farmacias pueden ofrecer precios ms baratos.  El sitio web www.goodrx.com tiene cupones para medicamentos  de [employer]. Los precios aqu no tienen en cuenta lo que podra costar con la ayuda del seguro (puede ser ms barato con su seguro), pero el sitio web puede darle el precio si no utiliz Tourist information centre manager.  - Puede imprimir el cupn correspondiente y llevarlo con su receta a la farmacia.  - Tambin puede pasar por nuestra oficina durante el horario de atencin regular y Education officer, museum una tarjeta de cupones de GoodRx.  - Si necesita que su receta se enve electrnicamente a una farmacia diferente, informe a nuestra oficina a travs de MyChart de  o por telfono llamando al [phone] y presione la opcin 4.

## 2024-05-01 NOTE — Progress Notes (Signed)
   Follow-Up Visit   Subjective  Tammy Vang is a 68 y.o. female who presents for the following: big toenail at left foot has turned white and is coming off. Patient advises it has been like that for weeks, she did drop something on the left toes a few days ago. Big toenail at right foot is peeling.  Patient would also like to know if there is anything she can use for ant bites.   The following portions of the chart were reviewed this encounter and updated as appropriate: medications, allergies, medical history  Review of Systems:  No other skin or systemic complaints except as noted in HPI or Assessment and Plan.  Objective  Well appearing patient in no apparent distress; mood and affect are within normal limits.   A focused examination was performed of the following areas: Feet, toenails, hands, fingernails  Relevant exam findings are noted in the Assessment and Plan.    Assessment & Plan       ONYCHOMYCOSIS and onychomadesis Exam: Thickened toenails with subungal debris c/w onychomycosis. L toenail with proximal separation (onychomadesis).   Chronic and persistent condition with duration or expected duration over one year. Condition is symptomatic/ bothersome to patient. Not currently at goal.  Treatment Plan: Patient on metoprolol  and can not take Lamisil. Patient on apixaban  and cannot take fluconazole.  Terbinafine is an anti-fungal medicine that can be applied to the skin (over the counter) or taken by mouth (prescription) to treat fungal infections. The pill version is often used to treat fungal infections of the nails or scalp. While most people do not have any side effects from taking terbinafine pills, some possible side effects of the medicine can include taste changes, headache, loss of smell, vision changes, nausea, vomiting, or diarrhea.   Rare side effects can include irritation of the liver, allergic reaction, or decrease in blood counts (which may show up as  not feeling well or developing an infection). If you are concerned about any of these side effects, please stop the medicine and call your doctor, or in the case of an emergency such as feeling very unwell, seek immediate medical care.   Wear clean dry socks. Wear footwear at public pools. High recurrence rate. 1/7 chance of clearing with topicals  Start ciclopirox  nail solution daily to affected toenails.   Onychorrhexis Exam:linear ridges of fingernails  Plan: no treatment. Physiologic  INSECT BITE REACTION Exam: inflamed papules onhands  Treatment Plan: Start clobetasol  0.05% cr twice daily prn itch. Avoid applying to face, groin, and axilla. Use as directed. Long-term use can cause thinning of the skin.  Topical steroids (such as triamcinolone, fluocinolone, fluocinonide, mometasone, clobetasol , halobetasol, betamethasone , hydrocortisone) can cause thinning and lightening of the skin if they are used for too long in the same area. Your physician has selected the right strength medicine for your problem and area affected on the body. Please use your medication only as directed by your physician to prevent side effects.    ONYCHOMYCOSIS Left Great Toenail Patient advised to let nail come off on its own.  ONYCHOMADESIS   ONYCHORRHEXIS   ARTHROPOD BITE, INITIAL ENCOUNTER    Return if symptoms worsen or fail to improve.  Tammy Vang Tammy Vang, RMA, am acting as scribe for Tammy Sharps, MD .   Documentation: I have reviewed the above documentation for accuracy and completeness, and I agree with the above.  Tammy Sharps, MD

## 2024-05-14 ENCOUNTER — Encounter (INDEPENDENT_AMBULATORY_CARE_PROVIDER_SITE_OTHER): Payer: Self-pay | Admitting: Vascular Surgery

## 2024-05-14 ENCOUNTER — Ambulatory Visit (INDEPENDENT_AMBULATORY_CARE_PROVIDER_SITE_OTHER): Admitting: Vascular Surgery

## 2024-05-14 VITALS — BP 122/74 | HR 84 | Resp 18 | Ht 67.0 in | Wt 212.0 lb

## 2024-05-14 DIAGNOSIS — I824Y9 Acute embolism and thrombosis of unspecified deep veins of unspecified proximal lower extremity: Secondary | ICD-10-CM | POA: Diagnosis not present

## 2024-05-14 NOTE — Progress Notes (Signed)
 MRN : [id number]  Tammy Vang is a 68 y.o. (1956/05/31) female who presents with chief complaint of legs hurt and swell.  History of Present Illness:   The patient returns for follow-up regarding placement of an IVC filter.  She has a medical history significant of cervical radiculopathy with recent cervical spine surgery last week presenting with DVT.  Patient noted had cervical spine surgery with Dr. Frederic Don at the [facility] in [place].  Patient reports decreased mobility at home.  Worsening right lower extremity swelling and pain over similar timeframe.  No reported trauma.  No reported recent extended travel outside the country.  Not on OCPs.  Denies any prior history of blood clots in the past.  Noted had multiple orthopedic surgeries to the right foot including bunionectomy, hammertoe surgery as well as tendon repair.     Presented to the ER 02/15/2024 afebrile, hemodynamically stable.  Satting well on room air.  White count 5.9, hemoglobin 14.2, platelets 179, creatinine 0.79.  Lower extremity ultrasound noted with deep venous thrombosis is noted in the right common femoral, saphenofemoral junction, posterior tibial and profunda femoral Veins.  Patient does not complain of pain to her right lower extremity but is very much bothered by the swelling.   [place] filter was placed Feb 16, 2024   She has started Eliquis  at full dose.   Since placement of the filter she has not noted any major changes in her lower extremities.  She denies severe swelling of the right lower extremity.  No leg pain.   Duplex ultrasound of the venous system right lower extremity demonstrates full compressibility no evidence of chronic DVT no evidence of acute changes.  There appears to have been resolution of her thrombus.  No outpatient medications have been marked as taking for the 05/14/24 encounter (Appointment) with Jama, Cordella MATSU, MD.    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   Arthritis    knees   PONV (postoperative nausea and vomiting)    after D&C and Cholecystectomy   SVT (supraventricular tachycardia) (HCC)     Past Surgical History:  Procedure Laterality Date   BUNIONECTOMY Right    BUNIONECTOMY Right 01/22/2020   Procedure: BUNIONECTOMY LAPIDUS TYPE + AKIN RIGHT;  Surgeon: Lennie Barter, DPM;  Location[facility];  Service: Podiatry;  Laterality: Right;  pre op anes. pop/saph block   CHOLECYSTECTOMY     COLONOSCOPY     DILATION AND CURETTAGE OF UTERUS     FLAT FOOT CORRECTION Right 01/22/2020   Procedure: EVANS/MCDO;  Surgeon: Lennie Barter, DPM;  Location: [facility];  Service: Podiatry;  Laterality: Right;   HAMMER TOE SURGERY Right 01/22/2020   Procedure: HAMMER TOE CORRECTION;  Surgeon: Lennie Barter, DPM;  Location: [facility];  Service: Podiatry;  Laterality: Right;   IVC FILTER INSERTION N/A 02/16/2024   Procedure: IVC FILTER INSERTION;  Surgeon: Jama Cordella MATSU, MD;  Location: [facility] INVASIVE CV LAB;  Service: Cardiovascular;  Laterality: N/A;   TENDON REPAIR Right 10/24/2020   Procedure: FLEXOR TENDON REPAIR SECONDARY RIGHT; NONUNION REPAIR SECONDARDY RIGHT;  Surgeon: Ashley Soulier, DPM;  Location: [facility] ORS;  Service: Podiatry;  Laterality: Right;    Social History Social History   Tobacco Use   Smoking status: Never   Smokeless tobacco: Never  Vaping Use  Vaping status: Never Used  Substance Use Topics   Alcohol use: Not Currently   Drug use: Never    Family History Family History  Problem Relation Age of Onset   Breast cancer Neg Hx     Allergies  Allergen Reactions   Diclofenac Palpitations and Other (See Comments)    RESMBLES AN HEART ATTACK  Other Reaction(s): chest pain, irregular heart rate   Hydrocodone -Acetaminophen  Other (See Comments) and Nausea Only   Lactose Diarrhea and  Other (See Comments)    Other reaction(s): Abdominal Pain  Other Reaction(s): abdominal pain, other   Meloxicam  Palpitations   Gluten Meal Diarrhea    Also, - Chills, sweats, weakness   Lactose Intolerance (Gi) Other (See Comments)    Stomach cramps     REVIEW OF SYSTEMS (Negative unless checked)  Constitutional: [] Weight loss  [] Fever  [] Chills Cardiac: [] Chest pain   [] Chest pressure   [] Palpitations   [] Shortness of breath when laying flat   [] Shortness of breath with exertion. Vascular:  [] Pain in legs with walking   [x] Pain in legs at rest  [] History of DVT   [] Phlebitis   [x] Swelling in legs   [] Varicose veins   [] Non-healing ulcers Pulmonary:   [] Uses home oxygen   [] Productive cough   [] Hemoptysis   [] Wheeze  [] COPD   [] Asthma Neurologic:  [] Dizziness   [] Seizures   [] History of stroke   [] History of TIA  [] Aphasia   [] Vissual changes   [] Weakness or numbness in arm   [] Weakness or numbness in leg Musculoskeletal:   [] Joint swelling   [] Joint pain   [] Low back pain Hematologic:  [] Easy bruising  [] Easy bleeding   [] Hypercoagulable state   [] Anemic Gastrointestinal:  [] Diarrhea   [] Vomiting  [] Gastroesophageal reflux/heartburn   [] Difficulty swallowing. Genitourinary:  [] Chronic kidney disease   [] Difficult urination  [] Frequent urination   [] Blood in urine Skin:  [] Rashes   [] Ulcers  Psychological:  [] History of anxiety   []  History of major depression.  Physical Examination  There were no vitals filed for this visit. There is no height or weight on file to calculate BMI. Gen: WD/WN, NAD Head: [place]/AT, No temporalis wasting.  Ear/Nose/Throat: Hearing grossly intact, nares w/o erythema or drainage, pinna without lesions Eyes: PER, EOMI, sclera nonicteric.  Neck: Supple, no gross masses.  No JVD.  Pulmonary:  Good air movement, no audible wheezing, no use of accessory muscles.  Cardiac: RRR, precordium not hyperdynamic. Vascular:  scattered varicosities present bilaterally.   Moderate venous stasis changes to the legs bilaterally.  2+ soft pitting edema. CEAP [id number]   Vessel Right Left  Radial Palpable Palpable  Gastrointestinal: soft, non-distended. No guarding/no peritoneal signs.  Musculoskeletal: M/S 5/5 throughout.  No deformity.  Neurologic: CN 2-12 intact. Pain and light touch intact in extremities.  Symmetrical.  Speech is fluent. Motor exam as listed above. Psychiatric: Judgment intact, Mood & affect appropriate for pt's clinical situation. Dermatologic: Venous rashes no ulcers noted.  No changes consistent with cellulitis. Lymph : No lichenification or skin changes of chronic lymphedema.  CBC Lab Results  Component Value Date   WBC 6.1 02/16/2024   HGB 13.4 02/16/2024   HCT 40.8 02/16/2024   MCV 91.3 02/16/2024   PLT 170 02/16/2024    BMET    Component Value Date/Time   NA 138 02/16/2024 0512   K 3.6 02/16/2024 0512   CL 105 02/16/2024 0512   CO2 24 02/16/2024 0512   GLUCOSE 92 02/16/2024 0512   BUN 11  02/16/2024 0512   CREATININE 0.66 02/16/2024 0512   CALCIUM 9.1 02/16/2024 0512   GFRNONAA >60 02/16/2024 0512   CrCl cannot be calculated (Patient's most recent lab result is older than the maximum 21 days allowed.).  COAG No results found for: INR, PROTIME  Radiology No results found.   Assessment/Plan 1. 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  IVC filter is not indicated at present.  The patient is on anticoagulation   Elevation was stressed, use of a recliner was discussed.  I have reviewed with the patient DVT and post phlebitic changes such as swelling and why it  causes symptoms such as pain.  I recommended to the patient to wear graduated compression stockings, beginning after three full days of anticoagulation.  Graduated compression should be worn on a daily basis. The  patient should wear compression beginning first thing in the morning and removing them in the evening. The patient is instructed specifically not to sleep in the stockings.  In addition, behavioral modification including elevation during the day and avoidance of prolonged dependency will be initiated.    The patient will continue anticoagulation for now as there have not been any problems or complications from anticoagulation therapy at this point.  The patient will follow-up with me with noninvasive studies as ordered.     Cordella Shawl, MD  05/14/2024 11:45 AM

## 2024-05-19 ENCOUNTER — Encounter (INDEPENDENT_AMBULATORY_CARE_PROVIDER_SITE_OTHER): Payer: Self-pay | Admitting: Vascular Surgery

## 2024-07-11 ENCOUNTER — Other Ambulatory Visit: Payer: Self-pay | Admitting: Internal Medicine

## 2024-07-11 DIAGNOSIS — I82401 Acute embolism and thrombosis of unspecified deep veins of right lower extremity: Secondary | ICD-10-CM

## 2024-07-13 ENCOUNTER — Ambulatory Visit
Admission: RE | Admit: 2024-07-13 | Discharge: 2024-07-13 | Disposition: A | Discharge status: Home / Self Care | Source: Ambulatory Visit | Attending: Internal Medicine | Admitting: Internal Medicine

## 2024-07-13 DIAGNOSIS — I82401 Acute embolism and thrombosis of unspecified deep veins of right lower extremity: Secondary | ICD-10-CM | POA: Diagnosis present

## 2024-08-16 ENCOUNTER — Ambulatory Visit (INDEPENDENT_AMBULATORY_CARE_PROVIDER_SITE_OTHER): Admitting: Vascular Surgery

## 2024-08-20 ENCOUNTER — Ambulatory Visit (INDEPENDENT_AMBULATORY_CARE_PROVIDER_SITE_OTHER): Admitting: Vascular Surgery

## 2024-08-20 ENCOUNTER — Encounter (INDEPENDENT_AMBULATORY_CARE_PROVIDER_SITE_OTHER): Payer: Self-pay | Admitting: Vascular Surgery

## 2024-08-20 ENCOUNTER — Other Ambulatory Visit: Payer: Self-pay | Admitting: Internal Medicine

## 2024-08-20 VITALS — BP 110/68 | HR 72 | Resp 18 | Ht 68.0 in | Wt 210.0 lb

## 2024-08-20 DIAGNOSIS — R600 Localized edema: Secondary | ICD-10-CM | POA: Diagnosis not present

## 2024-08-20 DIAGNOSIS — Z1231 Encounter for screening mammogram for malignant neoplasm of breast: Secondary | ICD-10-CM

## 2024-08-20 DIAGNOSIS — M5412 Radiculopathy, cervical region: Secondary | ICD-10-CM

## 2024-08-20 DIAGNOSIS — I82411 Acute embolism and thrombosis of right femoral vein: Secondary | ICD-10-CM | POA: Diagnosis not present

## 2024-08-20 NOTE — Progress Notes (Signed)
 MRN : [id number]  Tammy Vang is a 68 y.o. (1956/02/24) female who presents with chief complaint of legs hurt and swell.  History of Present Illness:   The patient returns for follow-up regarding placement of an IVC filter.  She has a medical history significant of cervical radiculopathy with recent cervical spine surgery last week presenting with DVT.  Patient noted had cervical spine surgery with Dr. Frederic Don at the [facility] in [place].  Patient reports decreased mobility at home.  Worsening right lower extremity swelling and pain over similar timeframe.  No reported trauma.  No reported recent extended travel outside the country.  Not on OCPs.  Denies any prior history of blood clots in the past.  Noted had multiple orthopedic surgeries to the right foot including bunionectomy, hammertoe surgery as well as tendon repair.     Presented to the ER 02/15/2024 afebrile, hemodynamically stable.  Satting well on room air.  White count 5.9, hemoglobin 14.2, platelets 179, creatinine 0.79.  Lower extremity ultrasound noted with deep venous thrombosis is noted in the right common femoral, saphenofemoral junction, posterior tibial and profunda femoral Veins.     [place] filter was placed Feb 16, 2024   Since the last visit she has been tolerating Eliquis  5 mg p.o. twice daily.   Since placement of the filter she has not noted any major changes in her lower extremities.  She denies severe swelling of the right lower extremity.  No leg pain.   Duplex ultrasound of the right lower extremity venous system done today demonstrates full compressibility no evidence of chronic DVT no evidence of acute changes.  There appears to have been resolution of her common femoral thrombus.  Some trivial chronic changes are noted in the great saphenous vein near the saphenofemoral junction  Current Meds  Medication Sig   apixaban  (ELIQUIS ) 5 MG TABS tablet Take 1 tablet (5 mg  total) by mouth every 12 (twelve) hours   metoprolol  succinate (TOPROL -XL) 50 MG 24 hr tablet Take 1 tablet (50 mg total) by mouth every evening. Take with or immediately following a meal.   olopatadine (PATANOL) 0.1 % ophthalmic solution Place 1 drop into both eyes 2 (two) times daily as needed for allergies.   sodium chloride  (OCEAN) 0.65 % SOLN nasal spray Place 1 spray into both nostrils 2 (two) times daily.    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   Arthritis    knees   PONV (postoperative nausea and vomiting)    after D&C and Cholecystectomy   SVT (supraventricular tachycardia)     Past Surgical History:  Procedure Laterality Date   BUNIONECTOMY Right    BUNIONECTOMY Right 01/22/2020   Procedure: BUNIONECTOMY LAPIDUS TYPE + AKIN RIGHT;  Surgeon: Lennie Barter, DPM;  Location: [facility];  Service: Podiatry;  Laterality: Right;  pre op anes. pop/saph block   CHOLECYSTECTOMY     COLONOSCOPY     DILATION AND CURETTAGE OF UTERUS     FLAT FOOT CORRECTION Right 01/22/2020   Procedure: EVANS/MCDO;  Surgeon: Lennie Barter, DPM;  Location: [facility];  Service: Podiatry;  Laterality: Right;   HAMMER TOE SURGERY Right 01/22/2020   Procedure: HAMMER TOE CORRECTION;  Surgeon: Lennie Barter, DPM;  Location: [facility];  Service: Podiatry;  Laterality: Right;   IVC FILTER INSERTION  N/A 02/16/2024   Procedure: IVC FILTER INSERTION;  Surgeon: Jama Cordella MATSU, MD;  Location: [facility] INVAS[facility];  Service: Cardiovascular;  Laterality: N/A;   TENDON REPAIR Right 10/24/2020   Procedure: FLEXOR TENDON REPAIR SECONDARY RIGHT; NONUNION REPAIR SECONDARDY RIGHT;  Surgeon: Ashley Soulier, DPM;  Location: [facility] ORS;  Service: Podiatry;  Laterality: Right;    Social History Social History   Tobacco Use   Smoking status: Never   Smokeless tobacco: Never  Vaping Use   Vaping status:  Never Used  Substance Use Topics   Alcohol use: Not Currently   Drug use: Never    Family History Family History  Problem Relation Age of Onset   Breast cancer Neg Hx     Allergies  Allergen Reactions   Diclofenac Palpitations and Other (See Comments)    RESMBLES AN HEART ATTACK  Other Reaction(s): chest pain, irregular heart rate   Hydrocodone -Acetaminophen  Other (See Comments) and Nausea Only   Lactose Diarrhea and Other (See Comments)    Other reaction(s): Abdominal Pain  Other Reaction(s): abdominal pain, other   Meloxicam  Palpitations   Gluten Meal Diarrhea    Also, - Chills, sweats, weakness   Lactose Intolerance (Gi) Other (See Comments)    Stomach cramps     REVIEW OF SYSTEMS (Negative unless checked)  Constitutional: [] Weight loss  [] Fever  [] Chills Cardiac: [] Chest pain   [] Chest pressure   [] Palpitations   [] Shortness of breath when laying flat   [] Shortness of breath with exertion. Vascular:  [] Pain in legs with walking   [x] Pain in legs at rest  [] History of DVT   [] Phlebitis   [x] Swelling in legs   [] Varicose veins   [] Non-healing ulcers Pulmonary:   [] Uses home oxygen   [] Productive cough   [] Hemoptysis   [] Wheeze  [] COPD   [] Asthma Neurologic:  [] Dizziness   [] Seizures   [] History of stroke   [] History of TIA  [] Aphasia   [] Vissual changes   [] Weakness or numbness in arm   [] Weakness or numbness in leg Musculoskeletal:   [] Joint swelling   [] Joint pain   [] Low back pain Hematologic:  [] Easy bruising  [] Easy bleeding   [] Hypercoagulable state   [] Anemic Gastrointestinal:  [] Diarrhea   [] Vomiting  [] Gastroesophageal reflux/heartburn   [] Difficulty swallowing. Genitourinary:  [] Chronic kidney disease   [] Difficult urination  [] Frequent urination   [] Blood in urine Skin:  [] Rashes   [] Ulcers  Psychological:  [] History of anxiety   []  History of major depression.  Physical Examination  Vitals:   08/20/24 0943  BP: 110/68  Pulse: 72  Resp: 18  Weight:  210 lb (95.3 kg)  Height: 5' 8 (1.727 m)   Body mass index is 31.93 kg/m. Gen: WD/WN, NAD Head: [place]/AT, No temporalis wasting.  Ear/Nose/Throat: Hearing grossly intact, nares w/o erythema or drainage, pinna without lesions Eyes: PER, EOMI, sclera nonicteric.  Neck: Supple, no gross masses.  No JVD.  Pulmonary:  Good air movement, no audible wheezing, no use of accessory muscles.  Cardiac: RRR, precordium not hyperdynamic. Vascular:  scattered varicosities present bilaterally.  Moderate venous stasis changes to the legs bilaterally.  1+ soft pitting edema. CEAP C4sEpAsPr   Vessel Right Left  Radial Palpable Palpable  Gastrointestinal: soft, non-distended. No guarding/no peritoneal signs.  Musculoskeletal: M/S 5/5 throughout.  No deformity.  Neurologic: CN 2-12 intact. Pain and light touch intact in extremities.  Symmetrical.  Speech is fluent. Motor exam as listed above. Psychiatric: Judgment intact, Mood & affect appropriate for pt's clinical  situation. Dermatologic: Venous rashes no ulcers noted.  No changes consistent with cellulitis. Lymph : No lichenification or skin changes of chronic lymphedema.  CBC Lab Results  Component Value Date   WBC 6.1 02/16/2024   HGB 13.4 02/16/2024   HCT 40.8 02/16/2024   MCV 91.3 02/16/2024   PLT 170 02/16/2024    BMET    Component Value Date/Time   NA 138 02/16/2024 0512   K 3.6 02/16/2024 0512   CL 105 02/16/2024 0512   CO2 24 02/16/2024 0512   GLUCOSE 92 02/16/2024 0512   BUN 11 02/16/2024 0512   CREATININE 0.66 02/16/2024 0512   CALCIUM 9.1 02/16/2024 0512   GFRNONAA >60 02/16/2024 0512   CrCl cannot be calculated (Patient's most recent lab result is older than the maximum 21 days allowed.).  COAG No results found for: INR, PROTIME  Radiology No results found.   Assessment/Plan 1. Deep vein thrombosis (DVT) of femoral vein of right lower extremity, unspecified chronicity (HCC) (Primary) The patient has done well status  post her C-spine surgery.  Therefore, I recommend that we remove the IVC filter.  Risk and benefits were reviewed all questions answered patient has agreed to proceed.  We also discussed cessation of her Eliquis  therapy.  We will focus on filter removal first and then finalize our plan regarding stopping her anticoagulation.  Given that this was her first DVT and that it was provoked by a significant surgical procedure I do believe that the patient should either continue half dose therapy or just stop anticoagulation once the IVC filter is removed.  Patient will continue to elevate to minimize swelling.  Given the history of DVT and the possibility of post phlebitic changes such as swelling and discomfort the patient should wear graduated compression stockings.  The compression should be worn on a daily basis.  In addition, behavioral modification including elevation during the day and avoidance of prolonged dependency is helpful.    The patient will follow-up with me after the filter removal.  2. Cervical radiculopathy The patient has reasonable range of motion and is not having any pain at this time.  She appears to have done reasonably well from her surgery.  3. Leg edema Recommend:  No surgery or intervention at this point in time.    I have reviewed my discussion with the patient regarding secondary lymph edema and why it  causes symptoms. I have discussed with the patient the chronic skin changes that accompany these problems and the long term sequela such as ulceration and infection.  Patient will continue wearing graduated compression on a daily basis a prescription, if needed, was given to the patient to keep this updated. The patient will  put the compression on first thing in the morning and removing them in the evening. The patient is instructed specifically not to sleep in the compression.  In addition, behavioral modification including elevation during the day will be continued.   Diet and salt restriction will also be helpful.       Cordella Shawl, MD  08/20/2024 10:00 AM

## 2024-09-04 ENCOUNTER — Telehealth (INDEPENDENT_AMBULATORY_CARE_PROVIDER_SITE_OTHER): Payer: Self-pay

## 2024-09-04 NOTE — Telephone Encounter (Signed)
 Spoke with the patient and she is scheduled with Dr. Jama for a IVC filter removal on 09/12/24 with a 1:00 pm arrival time to the [facility]. Pre-procedure instructions were discussed and will be sent to Mychart and mailed.

## 2024-09-18 ENCOUNTER — Encounter
Admission: RE | Disposition: A | Discharge status: Home / Self Care | Payer: Self-pay | Source: Home / Self Care | Attending: Vascular Surgery

## 2024-09-18 ENCOUNTER — Encounter: Payer: Self-pay | Admitting: Vascular Surgery

## 2024-09-18 ENCOUNTER — Ambulatory Visit
Admission: RE | Admit: 2024-09-18 | Discharge: 2024-09-18 | Disposition: A | Attending: Vascular Surgery | Admitting: Vascular Surgery

## 2024-09-18 DIAGNOSIS — M5412 Radiculopathy, cervical region: Secondary | ICD-10-CM | POA: Diagnosis not present

## 2024-09-18 DIAGNOSIS — Z7901 Long term (current) use of anticoagulants: Secondary | ICD-10-CM | POA: Insufficient documentation

## 2024-09-18 DIAGNOSIS — Z86718 Personal history of other venous thrombosis and embolism: Secondary | ICD-10-CM | POA: Diagnosis not present

## 2024-09-18 DIAGNOSIS — R600 Localized edema: Secondary | ICD-10-CM | POA: Diagnosis not present

## 2024-09-18 DIAGNOSIS — Z9889 Other specified postprocedural states: Secondary | ICD-10-CM | POA: Diagnosis not present

## 2024-09-18 DIAGNOSIS — Z4589 Encounter for adjustment and management of other implanted devices: Secondary | ICD-10-CM | POA: Diagnosis present

## 2024-09-18 DIAGNOSIS — Z86711 Personal history of pulmonary embolism: Secondary | ICD-10-CM

## 2024-09-18 DIAGNOSIS — I82409 Acute embolism and thrombosis of unspecified deep veins of unspecified lower extremity: Secondary | ICD-10-CM

## 2024-09-18 HISTORY — PX: IVC FILTER REMOVAL: CATH118246

## 2024-09-18 SURGERY — IVC FILTER REMOVAL
Anesthesia: Moderate Sedation

## 2024-09-18 MED ORDER — FENTANYL CITRATE (PF) 100 MCG/2ML IJ SOLN
INTRAMUSCULAR | Status: AC
  Filled 2024-09-18: qty 2

## 2024-09-18 MED ORDER — MIDAZOLAM HCL (PF) 2 MG/2ML IJ SOLN
INTRAMUSCULAR | Status: DC | PRN
  Administered 2024-09-18: 2 mg via INTRAVENOUS

## 2024-09-18 MED ORDER — LIDOCAINE HCL (PF) 1 % IJ SOLN
INTRAMUSCULAR | Status: DC | PRN
  Administered 2024-09-18: 10 mL

## 2024-09-18 MED ORDER — IODIXANOL 320 MG/ML IV SOLN
INTRAVENOUS | Status: DC | PRN
  Administered 2024-09-18: 10 mL

## 2024-09-18 MED ORDER — MIDAZOLAM HCL 2 MG/ML PO SYRP: 8.0000 mg | ORAL_SOLUTION | Freq: Once | ORAL | Status: DC | PRN

## 2024-09-18 MED ORDER — HEPARIN (PORCINE) IN NACL 1000-0.9 UT/500ML-% IV SOLN
INTRAVENOUS | Status: DC | PRN
  Administered 2024-09-18: 500 mL

## 2024-09-18 MED ORDER — DIPHENHYDRAMINE HCL 50 MG/ML IJ SOLN: 50.0000 mg | Freq: Once | INTRAMUSCULAR | Status: DC | PRN

## 2024-09-18 MED ORDER — ONDANSETRON HCL 4 MG/2ML IJ SOLN
INTRAMUSCULAR | Status: DC
  Filled 2024-09-18: qty 2

## 2024-09-18 MED ORDER — HYDROMORPHONE HCL 1 MG/ML IJ SOLN: 1.0000 mg | Freq: Once | INTRAMUSCULAR | Status: DC | PRN

## 2024-09-18 MED ORDER — SODIUM CHLORIDE 0.9 % IV SOLN: INTRAVENOUS | Status: DC

## 2024-09-18 MED ORDER — CEFAZOLIN SODIUM-DEXTROSE 2-4 GM/100ML-% IV SOLN
2.0000 g | INTRAVENOUS | Status: AC
  Administered 2024-09-18: 2 g via INTRAVENOUS

## 2024-09-18 MED ORDER — MIDAZOLAM HCL 2 MG/2ML IJ SOLN
INTRAMUSCULAR | Status: AC
  Filled 2024-09-18: qty 2

## 2024-09-18 MED ORDER — ONDANSETRON HCL 4 MG/2ML IJ SOLN
4.0000 mg | Freq: Four times a day (QID) | INTRAMUSCULAR | Status: DC | PRN
  Administered 2024-09-18: 4 mg via INTRAVENOUS

## 2024-09-18 MED ORDER — FENTANYL CITRATE (PF) 100 MCG/2ML IJ SOLN
INTRAMUSCULAR | Status: DC | PRN
  Administered 2024-09-18: 50 ug via INTRAVENOUS

## 2024-09-18 MED ORDER — FAMOTIDINE 20 MG PO TABS: 40.0000 mg | ORAL_TABLET | Freq: Once | ORAL | Status: DC | PRN

## 2024-09-18 MED ORDER — METHYLPREDNISOLONE SODIUM SUCC 125 MG IJ SOLR: 125.0000 mg | Freq: Once | INTRAMUSCULAR | Status: DC | PRN

## 2024-09-18 SURGICAL SUPPLY — 10 items
CATH BEACON 5 .035 40 KMP TP (CATHETERS) IMPLANT
COVER PROBE ULTRASOUND 5X96 (MISCELLANEOUS) IMPLANT
KIT SNARE RETRIEVAL 3-LOOP (MISCELLANEOUS) IMPLANT
NDL ENTRY 21GA 7CM ECHOTIP (NEEDLE) IMPLANT
NEEDLE ENTRY 21GA 7CM ECHOTIP (NEEDLE) IMPLANT
PACK ANGIOGRAPHY (CUSTOM PROCEDURE TRAY) ×1 IMPLANT
SET INTRO CAPELLA COAXIAL (SET/KITS/TRAYS/PACK) IMPLANT
SHEATH BRITE TIP 5FRX11 (SHEATH) IMPLANT
SUT MNCRL AB 4-0 PS2 18 (SUTURE) IMPLANT
WIRE J 3MM .035X145CM (WIRE) IMPLANT

## 2024-09-18 NOTE — Op Note (Signed)
  OPERATIVE NOTE   PRE-OPERATIVE DIAGNOSIS: History of DVT at the time of C-spine surgery and therefore anticoagulation could not be utilized  POST-OPERATIVE DIAGNOSIS: Same  PROCEDURE: Retrieval of IVC Filter Inferior Vena Cavagram  SURGEON: Cordella JUDITHANN Shawl, M.D.  ANESTHESIA:  Conscious sedation was administered under my direct supervision by the interventional radiology RN. IV Versed  plus fentanyl  were utilized. Continuous ECG, pulse oximetry and blood pressure was monitored throughout the entire procedure. Conscious sedation was for a total of 21 minutes and 42 seconds.  ESTIMATED BLOOD LOSS: Minimal cc  FINDING(S):inferior vena cava is widely patent filter is in place in good position. Filter is removed without incident  SPECIMEN(S):  IVC filter intact  INDICATIONS:   Tammy Vang is a 68 y.o. female who presents with DVT and PE. The patient has now tolerated anticoagulation for several months. Therefore, the IVC filter is recommended to be removed. The risks and benefits were reviewed with the patient all questions were answered and they agreed to proceed with IVC filter retrieval. Oral anticoagulation will be continued.  DESCRIPTION: After obtaining full informed written consent, the patient was brought back to the Special Procedure Suite and placed in the supine position.  The patient received IV antibiotics prior to induction.  After obtaining adequate sedation, the patient was prepped and draped in the standard fashion and appropriate time out is called.     Ultrasound was placed in a sterile sleeve.The right neck was then imaged with ultrasound.   Jugular vein was identified it is echolucent and homogeneous indicating patency. 1% lidocaine  is then infiltrated under ultrasound visualization and subsequently a Seldinger needle is inserted under real-time ultrasound guidance.  J-wire is then advanced into the inferior vena cava under fluoroscopic guidance. With the tip of the  sheath at the confluence of the iliac veins inferior vena caval imaging is performed.  After review of the image the sheath is repositioned to above the filter and the snares introduced. Snares opened and the hook is secured without difficulty. The filter is then collapsed within the sheath and removed without difficulty.  Sheath is removed by pressures held the patient tolerated the procedure well and there were no immediate complications.  Interpretation: inferior vena cava is widely patent filter is in place in good position. Filter is removed without incident.     COMPLICATIONS: None  CONDITION: Metta Cordella JUDITHANN Shawl, M.D. [facility] Office: [phone]   09/18/2024, 1:54 PM

## 2024-09-20 NOTE — Progress Notes (Unsigned)
 Electrophysiology Office Note:   Date:  09/21/2024  ID:  Tammy Vang, DOB 1956/08/29, MRN [id number]  Primary Cardiologist: None Electrophysiologist: Fonda Kitty, MD      History of Present Illness:   Tammy Vang is a 68 y.o. female with h/o arthritis seen today for Electrophysiology evaluation of SVT at the request of Dr. Jerral. Patient developed palpitations on 07/09/2023.  She called EMS and was found to be in SVT with a heart rate of 204 bpm.  She was given 6 mg of IV adenosine and SVT terminated.  She was brought to the ED and evaluated.  Workup was unremarkable and she was discharged home with referral for outpatient follow-up.  She then presented again to the ED on 07/24/2023 for palpitations, but these had resolved by the time of her evaluation.   Discussed the use of AI scribe software for clinical note transcription with the patient, who gave verbal consent to proceed.  History of Present Illness Tammy Vang is a 68 year old female who presents with episodes of heart racing.  She experienced an episode of heart racing on October 17th, which she attributes to consuming a gluten-free pizza with high salt content. Her heart rate was recorded in the 160s to 170s, lasting for at least an hour, from 6:58 PM to 8:28 PM. No dizziness or lightheadedness was noted during the episode. She took her metoprolol , which eventually helped to bring her heart rate down.  She has been taking metoprolol  once daily and carries an extra pill with her for emergencies. She notes that salt and caffeine are known triggers for her episodes. She inquires if a COVID booster could also be a trigger, as vaccines can cause an inflammatory response that might affect her heart rhythm.  She recalls a previous episode also triggered by high salt intake. She has had only one episode in the past six months and manages her condition with metoprolol , taking an extra half pill if needed during an episode.  No  dizziness or lightheadedness during the episode.    Review of systems complete and found to be negative unless listed in HPI.   EP Information / Studies Reviewed:    EKG is ordered today. Personal review as below.  EKG Interpretation Date/Time:  Friday September 21 2024 10:03:13 EST Ventricular Rate:  70 PR Interval:  226 QRS Duration:  84 QT Interval:  412 QTC Calculation: 444 R Axis:   31  Text Interpretation: Sinus rhythm with 1st degree A-V block Low voltage QRS When compared with ECG of 15-Feb-2024 09:52, No significant change was found Confirmed by Kitty Fonda [phone]) on 09/21/2024 10:16:33 AM   EMS EKG 07/09/23:    Echo 09/07/23:  1. Left ventricular ejection fraction, by estimation, is 55 to 60%. The  left ventricle has normal function. The left ventricle has no regional  wall motion abnormalities. Left ventricular diastolic parameters were  normal. The average left ventricular  global longitudinal strain is -18.9 %. The global longitudinal strain is  normal.   2. Right ventricular systolic function is normal. The right ventricular  size is normal.   3. Left atrial size was mildly dilated.   4. The mitral valve is abnormal. No evidence of mitral valve  regurgitation. No evidence of mitral stenosis.   5. The aortic valve is tricuspid. There is mild calcification of the  aortic valve. Aortic valve regurgitation is not visualized. Aortic valve  sclerosis is present, with no evidence of aortic valve stenosis.   6.  The inferior vena cava is normal in size with greater than 50%  respiratory variability, suggesting right atrial pressure of 3 mmHg.   Zio 08/2023: Patch Wear Time:  13 days and 23 hours (2024-11-02T09:19:18-0400 to 2024-11-16T08:07:19-0500)   HR 41 - 162, average 65 bpm. 4 nonsustained SVT (longest 16.7 seconds) and 2 nonsustained VT (longest 9 seconds). No atrial fibrillation detected. Rare supraventricular ectopy. Rare ventricular ectopy. No  sustained arrhythmias. Symptom trigger episodes correspond to sinus rhythm.   Physical Exam:   VS:  BP 120/74 (BP Location: Left Arm, Patient Position: Sitting, Cuff Size: Large)   Pulse 70   Ht 5' 8 (1.727 m)   Wt 218 lb (98.9 kg)   SpO2 99%   BMI 33.15 kg/m    Wt Readings from Last 3 Encounters:  09/21/24 218 lb (98.9 kg)  09/18/24 220 lb (99.8 kg)  08/20/24 210 lb (95.3 kg)     GEN: Well nourished, well developed in no acute distress NECK: No JVD CARDIAC: Regular rate and rhythm. RESPIRATORY:  Clear to auscultation without rales, wheezing or rhonchi  ABDOMEN: Soft, non-tender, non-distended EXTREMITIES:  No edema; No deformity   ASSESSMENT AND PLAN:   Tammy Vang is a 68 y.o. female with h/o arthritis seen today for follow-up evaluation of SVT.  #SVT: She had a sustained episode of narrow complex regular tachycardia with rates of 200 bpm.  Difficult to discern P waves given the quality of ECG from EMS.  Reportedly tachycardia terminated with IV adenosine, suggesting nodal involvement, although 30 to 40% of focal atrial tachycardia can be adenosine sensitive.  States that she has had 1 episode in the past 6 months since starting daily metoprolol . #Palpitations: -I discussed with her again that catheter ablation would be an option for her should she choose to pursue it.  At this time she does not feel episodes are frequent enough or symptoms severe enough to justify invasive procedure.  If she were to have more sustained episodes, then I think readdressing ablation or starting antiarrhythmic drug therapy would be appropriate. - We will continue metoprolol  XL 50 mg each evening.  I instructed her that she could take an additional 25 mg for sustained episodes.  Follow up with EP APP in 6 months.   Signed, Fonda Kitty, MD

## 2024-09-21 ENCOUNTER — Encounter: Payer: Self-pay | Admitting: Cardiology

## 2024-09-21 ENCOUNTER — Ambulatory Visit: Attending: Internal Medicine | Admitting: Cardiology

## 2024-09-21 VITALS — BP 120/74 | HR 70 | Ht 68.0 in | Wt 218.0 lb

## 2024-09-21 DIAGNOSIS — I471 Supraventricular tachycardia, unspecified: Secondary | ICD-10-CM

## 2024-09-21 DIAGNOSIS — R002 Palpitations: Secondary | ICD-10-CM

## 2024-09-21 MED ORDER — METOPROLOL SUCCINATE ER 50 MG PO TB24: 50.0000 mg | ORAL_TABLET | Freq: Every evening | ORAL | 3 refills | Status: AC

## 2024-09-21 NOTE — Patient Instructions (Signed)

## 2024-09-24 ENCOUNTER — Ambulatory Visit
Admission: RE | Admit: 2024-09-24 | Discharge: 2024-09-24 | Disposition: A | Source: Ambulatory Visit | Attending: Internal Medicine | Admitting: Internal Medicine

## 2024-09-24 DIAGNOSIS — Z1231 Encounter for screening mammogram for malignant neoplasm of breast: Secondary | ICD-10-CM | POA: Insufficient documentation

## 2024-10-19 ENCOUNTER — Encounter (INDEPENDENT_AMBULATORY_CARE_PROVIDER_SITE_OTHER): Payer: Self-pay | Admitting: Nurse Practitioner

## 2024-10-19 ENCOUNTER — Ambulatory Visit (INDEPENDENT_AMBULATORY_CARE_PROVIDER_SITE_OTHER): Admitting: Nurse Practitioner

## 2024-10-19 VITALS — BP 113/72 | HR 77 | Resp 17 | Ht 68.0 in | Wt 233.2 lb

## 2024-10-19 DIAGNOSIS — I82411 Acute embolism and thrombosis of right femoral vein: Secondary | ICD-10-CM | POA: Diagnosis not present

## 2024-10-28 ENCOUNTER — Encounter (INDEPENDENT_AMBULATORY_CARE_PROVIDER_SITE_OTHER): Payer: Self-pay | Admitting: Nurse Practitioner

## 2024-10-28 NOTE — Progress Notes (Signed)
 "  Subjective:    Patient ID: Tammy Vang, female    DOB: [birth year], 69 y.o.   MRN: [id number] Chief Complaint  Patient presents with   Routine Post Op    1 month (10/19/2024); follow up after procedure     HPI  Discussed the use of AI scribe software for clinical note transcription with the patient, who gave verbal consent to proceed.  History of Present Illness Tammy Vang is a 69 year old female with acute right lower extremity deep vein thrombosis, postthrombotic syndrome, and recent IVC filter removal who presents for vascular surgery follow-up and anticoagulation management.  She denies pain or bleeding at the site.  She has been on apixaban  for eight months, initially at 5 mg twice daily, without significant bruising or bleeding. She noted a single ecchymosis recently, likely related to minor trauma, and has not missed doses. She has gluten intolerance but has not experienced adverse effects from apixaban  despite the pharmacist noting it contains wheat.  Her acute right lower extremity DVT developed within days of neck surgery. She has a history of three prior right foot and ankle surgeries, all on the right side, but no prior history of venous thromboembolism. She experiences intermittent right foot edema, which worsens at the end of the day and improves after overnight recumbency. She uses compression stockings regularly for symptomatic relief and warmth. She reports occasional right calf cramping, including one episode on the day of the visit, but denies persistent or severe cramping.    Results     Review of Systems  Cardiovascular:  Positive for leg swelling.  Musculoskeletal:  Positive for gait problem.  All other systems reviewed and are negative.      Objective:   Physical Exam Vitals reviewed.  HENT:     Head: Normocephalic.  Cardiovascular:     Rate and Rhythm: Normal rate.     Pulses: Normal pulses.  Pulmonary:     Effort: Pulmonary effort is normal.   Musculoskeletal:     Right lower leg: Edema present.  Skin:    General: Skin is warm and dry.  Neurological:     Mental Status: She is alert and oriented to person, place, and time.  Psychiatric:        Mood and Affect: Mood normal.        Behavior: Behavior normal.        Thought Content: Thought content normal.        Judgment: Judgment normal.     Physical Exam NECK: Neck well healed, barely visible mark.  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   Arthritis    knees   PONV (postoperative nausea and vomiting)    after D&C and Cholecystectomy   SVT (supraventricular tachycardia)     Social History   Socioeconomic History   Marital status: Married    Spouse name: Not on file   Number of children: Not on file   Years of education: Not on file   Highest education level: Not on file  Occupational History   Not on file  Tobacco Use   Smoking status: Never   Smokeless tobacco: Never  Vaping Use   Vaping status:  Never Used  Substance and Sexual Activity   Alcohol use: Not Currently   Drug use: Never   Sexual activity: Not Currently  Other Topics Concern   Not on file  Social History Narrative   Not on file   Social Drivers of Health   Tobacco Use: Low Risk (10/28/2024)   Patient History    Smoking Tobacco Use: Never    Smokeless Tobacco Use: Never    Passive Exposure: Not on file  Financial Resource Strain: Low Risk  (09/10/2024)   Received from [facility]   Overall Financial Resource Strain (CARDIA)    Difficulty of Paying Living Expenses: Not hard at all  Food Insecurity: No Food Insecurity (09/10/2024)   Received from [facility]   Epic    Within the past 12 months, you worried that your food would run out before you got the  money to buy more.: Never true    Within the past 12 months, the food you bought just didn't last and you didn't have money to get more.: Never true  Transportation Needs: No Transportation Needs (09/10/2024)   Received from [facility] - Transportation    In the past 12 months, has lack of transportation kept you from medical appointments or from getting medications?: No    Lack of Transportation (Non-Medical): No  Physical Activity: Not on file  Stress: Not on file  Social Connections: Socially Integrated (02/15/2024)   Social Connection and Isolation Panel    Frequency of Communication with Friends and Family: More than three times a week    Frequency of Social Gatherings with Friends and Family: Never    Attends Religious Services: More than 4 times per year    Active Member of [employer] or Organizations: Yes    Attends Banker Meetings: Never    Marital Status: Married  Catering Manager Violence: Not At Risk (02/15/2024)   Humiliation, Afraid, Rape, and Kick questionnaire    Fear of Current or Ex-Partner: No    Emotionally Abused: No    Physically Abused: No    Sexually Abused: No  Depression (PHQ2-9): Not on file  Alcohol Screen: Not on file  Housing: Low Risk  (09/10/2024)   Received from [facility]   Epic    In the last 12 months, was there a time when you were not able to pay the mortgage or rent on time?: No    In the past 12 months, how many times have you moved where you were living?: 0    At any time in the past 12 months, were you homeless or living in a shelter (including now)?: No  Utilities: Not At Risk (09/10/2024)   Received from [facility]   Epic    In the past 12 months has the electric, gas, oil, or water company threatened to shut off services in your home?: No  Health Literacy: Not on file    Past Surgical History:  Procedure Laterality Date   BUNIONECTOMY Right    BUNIONECTOMY Right  01/22/2020   Procedure: BUNIONECTOMY LAPIDUS TYPE + AKIN RIGHT;  Surgeon: Lennie Barter, DPM;  Location: [facility];  Service: Podiatry;  Laterality: Right;  pre op anes. pop/saph block   CHOLECYSTECTOMY     COLONOSCOPY     DILATION AND CURETTAGE OF UTERUS     FLAT FOOT CORRECTION Right 01/22/2020   Procedure: EVANS/MCDO;  Surgeon: Lennie Barter, DPM;  Location: [facility];  Service: Podiatry;  Laterality: Right;   HAMMER TOE SURGERY Right 01/22/2020   Procedure: HAMMER TOE CORRECTION;  Surgeon: Lennie Barter, DPM;  Location: [facility];  Service: Podiatry;  Laterality: Right;   IVC FILTER INSERTION N/A 02/16/2024   Procedure: IVC FILTER INSERTION;  Surgeon: Jama Cordella MATSU, MD;  Location: [facility];  Service: Cardiovascular;  Laterality: N/A;   IVC FILTER REMOVAL N/A 09/18/2024   Procedure: IVC FILTER REMOVAL;  Surgeon: Jama Cordella MATSU, MD;  Location: [facility];  Service: Cardiovascular;  Laterality: N/A;   TENDON REPAIR Right 10/24/2020   Procedure: FLEXOR TENDON REPAIR SECONDARY RIGHT; NONUNION REPAIR SECONDARDY RIGHT;  Surgeon: Ashley Soulier, DPM;  Location: [facility];  Service: Podiatry;  Laterality: Right;    Family History  Problem Relation Age of Onset   Breast cancer Neg Hx     Allergies[1]     Latest Ref Rng & Units 02/16/2024    5:12 AM 02/15/2024    9:54 AM 07/24/2023    6:10 PM  CBC  WBC 4.0 - 10.5 K/uL 6.1  5.9  8.2   Hemoglobin 12.0 - 15.0 g/dL 86.5  85.7  85.2   Hematocrit 36.0 - 46.0 % 40.8  42.6  44.0   Platelets 150 - 400 K/uL 170  179  177       CMP     Component Value Date/Time   NA 138 02/16/2024 0512   K 3.6 02/16/2024 0512   CL 105 02/16/2024 0512   CO2 24 02/16/2024 0512   GLUCOSE 92 02/16/2024 0512   BUN 11 02/16/2024 0512   CREATININE 0.66 02/16/2024 0512   CALCIUM 9.1 02/16/2024 0512   PROT 6.4 (L) 02/16/2024 0512   ALBUMIN 3.8 02/16/2024 0512   AST 18 02/16/2024 0512   ALT 15 02/16/2024 0512    ALKPHOS 54 02/16/2024 0512   BILITOT 0.7 02/16/2024 0512   GFRNONAA >60 02/16/2024 0512     No results found.     Assessment & Plan:   1. Deep vein thrombosis (DVT) of femoral vein of right lower extremity, unspecified chronicity (HCC) (Primary) History of provoked deep vein thrombosis of right lower extremity First episode of provoked DVT due to surgical immobility and trauma. Long-term secondary prevention with apixaban  indicated. Lower-dose apixaban  reduces recurrence risk with minimal bleeding risk. Tolerated apixaban  despite gluten intolerance. - Continued apixaban  2.5 mg twice daily for long-term secondary prevention. - Approved splitting 5 mg tablets to achieve 2.5 mg dosing for cost efficiency. - Instructed to obtain 90-day supplies as permitted by insurance. - Advised to report significant bleeding, bruising, or intolerance to apixaban . - Provided education on signs and symptoms of recurrent DVT; instructed to report new or worsening leg swelling, pain, or persistent cramping. - Scheduled annual follow-up for vascular and anticoagulation management. Postthrombotic syndrome of right lower extremity Intermittent right foot swelling and cramping due to postthrombotic changes. Symptoms managed with compression therapy. - Recommended continued use of compression socks for symptom management. - Advised leg elevation and ambulation as tolerated to reduce swelling. - Provided anticipatory guidance regarding expected swelling pattern; instructed to report significant or persistent changes in swelling or pain. - Scheduled annual follow-up to monitor for progression and reassess management.  Assessment and Plan Assessment & Plan        Medications Ordered Prior to Encounter[2]  There are no Patient Instructions on file for this visit. Return in about 1 year (around 10/19/2025) for no studies  GS/FB.   Orvin FORBES Daring, NP      [1]  Allergies Allergen Reactions    Diclofenac Palpitations and Other (See Comments)    RESMBLES AN HEART ATTACK  Other Reaction(s): chest pain, irregular heart rate   Hydrocodone -Acetaminophen  Nausea Only, Other (See Comments) and Nausea And Vomiting   Lactose Diarrhea and Other (See Comments)    Other reaction(s): Abdominal Pain  Other Reaction(s): abdominal pain, other   Pine Shortness Of Breath    Sinuses shut down and pt unable to breathe   Meloxicam  Palpitations   Gluten Meal Diarrhea and Other (See Comments)    Also, - Chills, sweats, weakness   Lactose Intolerance (Gi) Other (See Comments)    Stomach cramps  [2]  Current Outpatient Medications on File Prior to Visit  Medication Sig Dispense Refill   apixaban  (ELIQUIS ) 5 MG TABS tablet Take 1 tablet (5 mg total) by mouth every 12 (twelve) hours     metoprolol  succinate (TOPROL -XL) 50 MG 24 hr tablet Take 1 tablet (50 mg total) by mouth every evening. Take with or immediately following a meal. 90 tablet 3   olopatadine (PATANOL) 0.1 % ophthalmic solution Place 1 drop into both eyes 2 (two) times daily as needed for allergies.     sodium chloride  (OCEAN) 0.65 % SOLN nasal spray Place 1 spray into both nostrils 2 (two) times daily.     No current facility-administered medications on file prior to visit.   "

## 2025-04-16 ENCOUNTER — Ambulatory Visit: Payer: Medicare Other | Admitting: Dermatology

## 2025-10-18 ENCOUNTER — Ambulatory Visit (INDEPENDENT_AMBULATORY_CARE_PROVIDER_SITE_OTHER): Admitting: Nurse Practitioner
